# Patient Record
Sex: Female | Born: 1975 | Race: Black or African American | Hispanic: No | Marital: Married | Smoking: Never smoker
Health system: Southern US, Community
[De-identification: ages and names within clinical notes are randomized; demographics above are authoritative.]

## PROBLEM LIST (undated history)

## (undated) DIAGNOSIS — K819 Cholecystitis, unspecified: Secondary | ICD-10-CM

## (undated) DIAGNOSIS — E079 Disorder of thyroid, unspecified: Secondary | ICD-10-CM

## (undated) DIAGNOSIS — R079 Chest pain, unspecified: Secondary | ICD-10-CM

## (undated) DIAGNOSIS — K8020 Calculus of gallbladder without cholecystitis without obstruction: Secondary | ICD-10-CM

## (undated) DIAGNOSIS — S83289A Other tear of lateral meniscus, current injury, unspecified knee, initial encounter: Secondary | ICD-10-CM

## (undated) DIAGNOSIS — Z641 Problems related to multiparity: Secondary | ICD-10-CM

## (undated) DIAGNOSIS — M25561 Pain in right knee: Secondary | ICD-10-CM

## (undated) HISTORY — DX: Disorder of thyroid, unspecified: E07.9

## (undated) HISTORY — DX: Pain in right knee: M25.561

## (undated) HISTORY — DX: Calculus of gallbladder without cholecystitis without obstruction: K80.20

## (undated) HISTORY — DX: Other tear of lateral meniscus, current injury, unspecified knee, initial encounter: S83.289A

## (undated) HISTORY — DX: Chest pain, unspecified: R07.9

## (undated) HISTORY — PX: APPENDECTOMY: SHX54

## (undated) HISTORY — DX: Problems related to multiparity: Z64.1

## (undated) HISTORY — PX: OTHER SURGICAL HISTORY: SHX169

## (undated) SURGERY — REPAIR, HERNIA, VENTRAL
Anesthesia: General

---

## 2007-03-29 ENCOUNTER — Ambulatory Visit: Payer: Self-pay | Admitting: Family Medicine

## 2007-06-20 ENCOUNTER — Ambulatory Visit: Payer: Self-pay | Admitting: Family Medicine

## 2007-07-10 ENCOUNTER — Ambulatory Visit: Payer: Self-pay | Admitting: Family Medicine

## 2007-07-18 ENCOUNTER — Ambulatory Visit: Payer: Self-pay | Admitting: Family Medicine

## 2007-09-19 ENCOUNTER — Ambulatory Visit: Payer: Self-pay | Admitting: Family Medicine

## 2007-12-19 ENCOUNTER — Ambulatory Visit: Payer: Self-pay | Admitting: Internal Medicine

## 2008-02-20 ENCOUNTER — Ambulatory Visit: Payer: Self-pay | Admitting: Family Medicine

## 2008-03-12 ENCOUNTER — Emergency Department (HOSPITAL_COMMUNITY): Admission: EM | Admit: 2008-03-12 | Discharge: 2008-03-12 | Payer: Self-pay | Admitting: Emergency Medicine

## 2008-03-28 ENCOUNTER — Encounter (INDEPENDENT_AMBULATORY_CARE_PROVIDER_SITE_OTHER): Payer: Self-pay | Admitting: Family Medicine

## 2008-03-28 ENCOUNTER — Ambulatory Visit: Payer: Self-pay | Admitting: Family Medicine

## 2008-03-28 ENCOUNTER — Other Ambulatory Visit: Admission: RE | Admit: 2008-03-28 | Discharge: 2008-03-28 | Payer: Self-pay | Admitting: Family Medicine

## 2008-03-30 ENCOUNTER — Encounter (INDEPENDENT_AMBULATORY_CARE_PROVIDER_SITE_OTHER): Payer: Self-pay | Admitting: Family Medicine

## 2008-04-02 ENCOUNTER — Ambulatory Visit: Payer: Self-pay | Admitting: Internal Medicine

## 2008-05-28 ENCOUNTER — Ambulatory Visit: Payer: Self-pay | Admitting: Family Medicine

## 2008-07-19 ENCOUNTER — Encounter: Payer: Self-pay | Admitting: Obstetrics & Gynecology

## 2008-07-19 ENCOUNTER — Ambulatory Visit: Payer: Self-pay | Admitting: Obstetrics & Gynecology

## 2008-07-23 ENCOUNTER — Ambulatory Visit (HOSPITAL_COMMUNITY): Admission: RE | Admit: 2008-07-23 | Discharge: 2008-07-23 | Payer: Self-pay | Admitting: Family Medicine

## 2008-07-29 ENCOUNTER — Ambulatory Visit: Payer: Self-pay | Admitting: *Deleted

## 2008-08-20 ENCOUNTER — Ambulatory Visit: Payer: Self-pay | Admitting: Internal Medicine

## 2008-10-02 ENCOUNTER — Ambulatory Visit: Payer: Self-pay | Admitting: Obstetrics and Gynecology

## 2008-10-07 ENCOUNTER — Ambulatory Visit (HOSPITAL_COMMUNITY): Admission: RE | Admit: 2008-10-07 | Discharge: 2008-10-07 | Payer: Self-pay | Admitting: Obstetrics & Gynecology

## 2008-11-06 ENCOUNTER — Ambulatory Visit: Payer: Self-pay | Admitting: Obstetrics and Gynecology

## 2008-12-31 ENCOUNTER — Ambulatory Visit (HOSPITAL_COMMUNITY): Admission: RE | Admit: 2008-12-31 | Discharge: 2008-12-31 | Payer: Self-pay | Admitting: Family Medicine

## 2009-01-01 ENCOUNTER — Ambulatory Visit: Payer: Self-pay | Admitting: Obstetrics and Gynecology

## 2009-01-02 ENCOUNTER — Encounter: Payer: Self-pay | Admitting: Obstetrics & Gynecology

## 2009-01-02 LAB — CONVERTED CEMR LAB
Clue Cells Wet Prep HPF POC: NONE SEEN
Yeast Wet Prep HPF POC: NONE SEEN

## 2009-04-22 ENCOUNTER — Emergency Department (HOSPITAL_COMMUNITY): Admission: EM | Admit: 2009-04-22 | Discharge: 2009-04-22 | Payer: Self-pay | Admitting: Family Medicine

## 2009-09-17 ENCOUNTER — Encounter: Admission: RE | Admit: 2009-09-17 | Discharge: 2009-09-17 | Payer: Self-pay | Admitting: Infectious Diseases

## 2009-10-06 ENCOUNTER — Emergency Department (HOSPITAL_COMMUNITY): Admission: EM | Admit: 2009-10-06 | Discharge: 2009-10-06 | Payer: Self-pay | Admitting: Emergency Medicine

## 2009-11-18 ENCOUNTER — Ambulatory Visit: Payer: Self-pay | Admitting: Family Medicine

## 2010-02-03 ENCOUNTER — Ambulatory Visit: Payer: Self-pay | Admitting: Family Medicine

## 2010-02-03 LAB — CONVERTED CEMR LAB
ALT: 9 units/L (ref 0–35)
AST: 14 units/L (ref 0–37)
Albumin: 3.9 g/dL (ref 3.5–5.2)
Alkaline Phosphatase: 42 units/L (ref 39–117)
Basophils Absolute: 0 10*3/uL (ref 0.0–0.1)
Glucose, Bld: 94 mg/dL (ref 70–99)
Hemoglobin: 11.7 g/dL — ABNORMAL LOW (ref 12.0–15.0)
LDL Cholesterol: 87 mg/dL (ref 0–99)
Lymphocytes Relative: 37 % (ref 12–46)
Monocytes Absolute: 0.5 10*3/uL (ref 0.1–1.0)
Neutro Abs: 2.4 10*3/uL (ref 1.7–7.7)
Neutrophils Relative %: 51 % (ref 43–77)
Potassium: 4 meq/L (ref 3.5–5.3)
RDW: 13.3 % (ref 11.5–15.5)
Sodium: 137 meq/L (ref 135–145)
Total Bilirubin: 0.3 mg/dL (ref 0.3–1.2)
Total Protein: 6.4 g/dL (ref 6.0–8.3)
VLDL: 7 mg/dL (ref 0–40)
Vit D, 25-Hydroxy: 21 ng/mL — ABNORMAL LOW (ref 30–89)

## 2010-02-24 ENCOUNTER — Ambulatory Visit: Payer: Self-pay | Admitting: Family Medicine

## 2010-02-24 LAB — CONVERTED CEMR LAB: Chlamydia, DNA Probe: NEGATIVE

## 2010-09-01 ENCOUNTER — Emergency Department (HOSPITAL_COMMUNITY): Admission: EM | Admit: 2010-09-01 | Discharge: 2010-09-01 | Payer: Self-pay | Admitting: Emergency Medicine

## 2010-09-22 ENCOUNTER — Ambulatory Visit (HOSPITAL_COMMUNITY)
Admission: RE | Admit: 2010-09-22 | Discharge: 2010-09-22 | Payer: Self-pay | Source: Home / Self Care | Admitting: Surgery

## 2010-10-08 ENCOUNTER — Emergency Department (HOSPITAL_COMMUNITY)
Admission: EM | Admit: 2010-10-08 | Discharge: 2010-10-09 | Payer: Self-pay | Source: Home / Self Care | Admitting: Emergency Medicine

## 2010-11-01 NOTE — L&D Delivery Note (Addendum)
Delivery Note At 10:07 AM a healthy female was delivered via Vaginal, Spontaneous Delivery (Presentation: Right Occiput Anterior).  APGAR: 9, 9; weight 7 lb 12 oz (3515 g).   Placenta status: Intact, Spontaneous.  Cord: 3 vessels with the following complications: Knot, nuchal cord  Anesthesia: Epidural  Episiotomy: None Lacerations: 1st degree;Perineal. 1st degree right anterior labial  Suture Repair: 2.0 3.0 vicryl Est. Blood Loss (mL): 500  Mom to postpartum.  Baby to nursery-stable.  Delivery attended by Candelaria Celeste, DO  [place], STEPHEN 10/08/2011, 10:44 AM  I precepted Dr Durene Cal, PGY1 during this delivery.  I agree with the note as above. Candelaria Celeste JEHIEL 10/08/2011 11:03 AM

## 2010-11-22 ENCOUNTER — Encounter: Payer: Self-pay | Admitting: *Deleted

## 2010-12-24 ENCOUNTER — Inpatient Hospital Stay (INDEPENDENT_AMBULATORY_CARE_PROVIDER_SITE_OTHER)
Admission: RE | Admit: 2010-12-24 | Discharge: 2010-12-24 | Disposition: A | Discharge status: Home / Self Care | Payer: Self-pay | Source: Ambulatory Visit | Attending: Family Medicine | Admitting: Family Medicine

## 2010-12-24 DIAGNOSIS — R070 Pain in throat: Secondary | ICD-10-CM

## 2010-12-24 LAB — POCT RAPID STREP A (OFFICE): Streptococcus, Group A Screen (Direct): NEGATIVE

## 2011-01-12 LAB — TSH: TSH: 2.678 u[IU]/mL (ref 0.350–4.500)

## 2011-01-12 LAB — POCT I-STAT, CHEM 8
Calcium, Ion: 1.13 mmol/L (ref 1.12–1.32)
Glucose, Bld: 95 mg/dL (ref 70–99)
HCT: 39 % (ref 36.0–46.0)
TCO2: 26 mmol/L (ref 0–100)

## 2011-02-08 LAB — POCT URINALYSIS DIP (DEVICE)
Glucose, UA: NEGATIVE mg/dL
Nitrite: NEGATIVE
Protein, ur: 30 mg/dL — AB
Specific Gravity, Urine: 1.025 (ref 1.005–1.030)
Urobilinogen, UA: 0.2 mg/dL (ref 0.0–1.0)

## 2011-02-08 LAB — GC/CHLAMYDIA PROBE AMP, GENITAL: GC Probe Amp, Genital: NEGATIVE

## 2011-02-08 LAB — POCT PREGNANCY, URINE: Preg Test, Ur: NEGATIVE

## 2011-02-11 LAB — POCT URINALYSIS DIP (DEVICE)
Bilirubin Urine: NEGATIVE
Ketones, ur: NEGATIVE mg/dL
Protein, ur: NEGATIVE mg/dL
pH: 5.5 (ref 5.0–8.0)

## 2011-03-04 ENCOUNTER — Ambulatory Visit: Payer: Self-pay | Admitting: Obstetrics and Gynecology

## 2011-03-04 ENCOUNTER — Other Ambulatory Visit: Payer: Self-pay | Admitting: Obstetrics and Gynecology

## 2011-03-04 DIAGNOSIS — Z3201 Encounter for pregnancy test, result positive: Secondary | ICD-10-CM

## 2011-03-16 ENCOUNTER — Encounter (INDEPENDENT_AMBULATORY_CARE_PROVIDER_SITE_OTHER): Payer: Self-pay | Admitting: Surgery

## 2011-03-16 NOTE — Group Therapy Note (Signed)
NAME:  Danielle Cohen, Danielle Cohen NO.:  [id number]   MEDICAL RECORD NO.:  [id number]          PATIENT TYPE:  WOC   LOCATION:  [facility]                   FACILITY:  [facility]   PHYSICIAN:  Argentina Donovan, MD        DATE OF BIRTH:  1976/01/28   DATE OF SERVICE:  01/01/2009                                  CLINIC NOTE   The patient is a 35 year old [place] lady, gravida 2, para 2-0-0-2, who  had recurrent ovarian cysts, and she used a Mirena IUD, was placed on  oral contraceptives 6 weeks ago.  A recent ultrasound shows complete  resolution.  Today, she complains of discomfort on her legs and around  the vagina.  Urinalysis was negative.  Wet prep was taken.  On  examination, the small area of the inner thigh on both sides with small  raised bumps that appear to be from friction between her thighs.  I have  suggested that she use an aluminum-based deodorant in that area after  showering and then in between use baby powder for lubrication to see if  that would reduce it around the vagina, and I have ordered Mycolog-II  cream generic and pending the report on the wet prep.  Although, I see  no real sign of infection at all.   IMPRESSION:  Irritation in the inner thighs due to friction and  vulvitis, and also the patient is complaining of mask of pregnancy.  Though she has had for some time, with her coloration, it is barely  visible.  However, I have suggested that she seek a dermatologist as I  am a little bit of afraid the use any kind of creams on the face because  of the thin skin and the possibility of causing a reversible thinning.  I told the patient if she is not asymptomatic with the rash in her legs  within the next couple of weeks to come back.           ______________________________  Argentina Donovan, MD     PR/MEDQ  D:  01/01/2009  T:  01/02/2009  Job:  [id number]

## 2011-03-16 NOTE — Group Therapy Note (Signed)
NAME:  [name], [name] NO.:  [id number]   MEDICAL RECORD NO.:  [id number]          PATIENT TYPE:  WOC   LOCATION:  [facility]                   FACILITY:  [facility]   PHYSICIAN:  Argentina Donovan, MD        DATE OF BIRTH:  04-21-1976   DATE OF SERVICE:                                  CLINIC NOTE   HISTORY:  The patient is a 35 year old [place] female who had a  followup because of an ovarian cyst and was previously seen, and had  previous pelvic pain which has resolved.  Was found to have an increased  size of septated verses 2 adjacent simple cysts of the right ovary from  a prior study.  It has been suggested that she has a followup again in 6  weeks.  We are going to follow through with that and get an ultrasound  to make sure there is nothing going on.  This probably is just a  functional cyst.  In addition, she does have a Mirena IUD.  I am going  to place her on oral contraceptive to see if we can resolve that cyst  quicker and have her move up her ultrasound to early March.  We will  have her come back again for followup after that.           ______________________________  Argentina Donovan, MD     PR/MEDQ  D:  11/06/2008  T:  11/06/2008  Job:  [id number]

## 2011-03-16 NOTE — Group Therapy Note (Signed)
NAME:  Danielle Cohen, RISING NO.:  [id number]   MEDICAL RECORD NO.:  [id number]          PATIENT TYPE:  WOC   LOCATION:  [facility]                   FACILITY:  [facility]   PHYSICIAN:  Elsie Lincoln, MD      DATE OF BIRTH:  03-20-1976   DATE OF SERVICE:  07/19/2008                                  CLINIC NOTE   The patient is a 35 year old, G4, para 4-0-0-5 female, LMP July 04, 2008, who presents to me from [facility].  The patient has had chronic  pelvic pain at some point during the day everyday for some time now.  The patient speaks Arabic.  We do have a language difficulty even though  language line [phone] was used.  The pain does little worse with urination.  If her bladder gets full, it does cause problems.  She has pain with  intercourse, not insertional, but deeper penetration.  She has no pain  with bowel movements.  She has daily bowel movements that are soft.  Tylenol and Advil do make the pain better.  She states that she is in a  good relationship with her husband.  He does not demand sex from her and  may only have sex when she is in the mood once or twice a week.  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  The patient has had a CT scan of the abdomen in May with no  acute abdominal process and the IUD in the correct position.  She has  had a history of appendectomy in the past.  She never had transvaginal  ultrasound.   PAST MEDICAL HISTORY:  Denies all problems.   PAST SURGICAL HISTORY:  Appendectomy.   OB HISTORY:  NSVD x4 with 1 twin delivery.   FAMILY HISTORY:  No history of breast, ovarian, uterine, or colon  cancer.   SOCIAL HISTORY:  No drinking, drugs, alcohol, or caffeine.   MEDICATIONS:  Tylenol p.r.n.   ALLERGIES:  None.  No allergy to latex.   GYNECOLOGIC HISTORY:  Uses an IUD, the Mirena, for contraception.  She  has intermittent periods due to the Mirena IUD, and I explained to her  this was  normal.   Thirteen-point review of systems is negative.   PHYSICAL EXAMINATION:  VITAL SIGNS:  Temperature 97.6, pulse 90, blood  pressure 105/68, weight 190.5, height 5 feet 3 inches.  GENERAL:  Well nourished, well developed, in no apparent distress.  HEENT:  Normocephalic, atraumatic.  CHEST:  Normal movement.  ABDOMEN:  Soft, nontender.  No rebound, no guarding, no hernia.  GENITALIA:  Tanner V, shaven.  Vagina pink, normal rugae.  Cervix large,  IUD string seen.  Urethra and bladder extremely tender.  Uterus  nontender and mobile, normal size, right adnexa, mild tenderness, no  masses.  Left adnexa nontender, no masses.  EXTREMITIES:  Nontender.   ASSESSMENT/PLAN:  A 35 year old female with chronic pelvic pain.  1. Pain much worse over bladder and urethra.  We will send urinalysis,      urine culture.  2. We will get transvaginal  ultrasound to evaluate ovaries better.  3. Return to clinic in 2-3 weeks.           ______________________________  Elsie Lincoln, MD     KL/MEDQ  D:  07/19/2008  T:  07/19/2008  Job:  [id number]   cc:   Dala Dock

## 2011-03-17 ENCOUNTER — Other Ambulatory Visit: Payer: Self-pay | Admitting: Physician Assistant

## 2011-03-17 ENCOUNTER — Other Ambulatory Visit: Payer: Self-pay | Admitting: Obstetrics and Gynecology

## 2011-03-17 DIAGNOSIS — O09529 Supervision of elderly multigravida, unspecified trimester: Secondary | ICD-10-CM

## 2011-03-17 DIAGNOSIS — Z331 Pregnant state, incidental: Secondary | ICD-10-CM

## 2011-03-17 DIAGNOSIS — Z3682 Encounter for antenatal screening for nuchal translucency: Secondary | ICD-10-CM

## 2011-03-17 LAB — GC/CHLAMYDIA PROBE AMP, GENITAL
Chlamydia: NEGATIVE
Gonorrhea: NEGATIVE

## 2011-03-17 LAB — POCT URINALYSIS DIP (DEVICE)
Bilirubin Urine: NEGATIVE
Ketones, ur: NEGATIVE mg/dL
Protein, ur: NEGATIVE mg/dL
pH: 7 (ref 5.0–8.0)

## 2011-03-17 LAB — CBC
Hemoglobin: 11.4 g/dL — AB (ref 12.0–16.0)
Platelets: 392 10*3/uL (ref 150–399)

## 2011-03-17 LAB — RPR: RPR: NONREACTIVE

## 2011-03-17 LAB — HEPATITIS B SURFACE ANTIGEN: Hepatitis B Surface Ag: NEGATIVE

## 2011-03-31 ENCOUNTER — Ambulatory Visit (HOSPITAL_COMMUNITY)
Admission: RE | Admit: 2011-03-31 | Discharge: 2011-03-31 | Disposition: A | Discharge status: Home / Self Care | Payer: Self-pay | Source: Ambulatory Visit | Attending: Physician Assistant | Admitting: Physician Assistant

## 2011-03-31 ENCOUNTER — Other Ambulatory Visit: Payer: Self-pay | Admitting: Physician Assistant

## 2011-03-31 DIAGNOSIS — O351XX0 Maternal care for (suspected) chromosomal abnormality in fetus, not applicable or unspecified: Secondary | ICD-10-CM | POA: Insufficient documentation

## 2011-03-31 DIAGNOSIS — O09529 Supervision of elderly multigravida, unspecified trimester: Secondary | ICD-10-CM

## 2011-03-31 DIAGNOSIS — O3510X0 Maternal care for (suspected) chromosomal abnormality in fetus, unspecified, not applicable or unspecified: Secondary | ICD-10-CM | POA: Insufficient documentation

## 2011-03-31 DIAGNOSIS — Z3682 Encounter for antenatal screening for nuchal translucency: Secondary | ICD-10-CM

## 2011-04-14 ENCOUNTER — Other Ambulatory Visit: Payer: Self-pay | Admitting: Family

## 2011-04-14 DIAGNOSIS — O09529 Supervision of elderly multigravida, unspecified trimester: Secondary | ICD-10-CM

## 2011-04-14 DIAGNOSIS — O0940 Supervision of pregnancy with grand multiparity, unspecified trimester: Secondary | ICD-10-CM

## 2011-04-14 LAB — POCT URINALYSIS DIP (DEVICE)
Ketones, ur: NEGATIVE mg/dL
Protein, ur: NEGATIVE mg/dL
Specific Gravity, Urine: 1.02 (ref 1.005–1.030)
Urobilinogen, UA: 0.2 mg/dL (ref 0.0–1.0)
pH: 8.5 — ABNORMAL HIGH (ref 5.0–8.0)

## 2011-05-12 ENCOUNTER — Other Ambulatory Visit: Payer: Self-pay | Admitting: Obstetrics and Gynecology

## 2011-05-12 DIAGNOSIS — O09529 Supervision of elderly multigravida, unspecified trimester: Secondary | ICD-10-CM

## 2011-05-12 LAB — POCT URINALYSIS DIP (DEVICE)
Hgb urine dipstick: NEGATIVE
Nitrite: NEGATIVE
Protein, ur: NEGATIVE mg/dL
Specific Gravity, Urine: 1.025 (ref 1.005–1.030)
Urobilinogen, UA: 0.2 mg/dL (ref 0.0–1.0)
pH: 7 (ref 5.0–8.0)

## 2011-05-19 ENCOUNTER — Ambulatory Visit (HOSPITAL_COMMUNITY)
Admission: RE | Admit: 2011-05-19 | Discharge: 2011-05-19 | Disposition: A | Discharge status: Home / Self Care | Payer: Medicaid Other | Source: Ambulatory Visit | Attending: Physician Assistant | Admitting: Physician Assistant

## 2011-05-19 ENCOUNTER — Encounter (HOSPITAL_COMMUNITY): Payer: Self-pay

## 2011-05-19 DIAGNOSIS — Z3689 Encounter for other specified antenatal screening: Secondary | ICD-10-CM | POA: Insufficient documentation

## 2011-05-19 DIAGNOSIS — O09529 Supervision of elderly multigravida, unspecified trimester: Secondary | ICD-10-CM

## 2011-06-16 ENCOUNTER — Other Ambulatory Visit: Payer: Self-pay | Admitting: Family Medicine

## 2011-06-16 ENCOUNTER — Ambulatory Visit: Payer: Medicaid Other | Admitting: Family Medicine

## 2011-06-16 DIAGNOSIS — IMO0002 Reserved for concepts with insufficient information to code with codable children: Secondary | ICD-10-CM

## 2011-06-16 DIAGNOSIS — E079 Disorder of thyroid, unspecified: Secondary | ICD-10-CM | POA: Insufficient documentation

## 2011-06-16 DIAGNOSIS — Z641 Problems related to multiparity: Secondary | ICD-10-CM | POA: Insufficient documentation

## 2011-06-16 DIAGNOSIS — O2340 Unspecified infection of urinary tract in pregnancy, unspecified trimester: Secondary | ICD-10-CM | POA: Insufficient documentation

## 2011-06-16 DIAGNOSIS — O09529 Supervision of elderly multigravida, unspecified trimester: Secondary | ICD-10-CM

## 2011-06-16 DIAGNOSIS — O0940 Supervision of pregnancy with grand multiparity, unspecified trimester: Secondary | ICD-10-CM

## 2011-06-16 HISTORY — DX: Problems related to multiparity: Z64.1

## 2011-06-16 LAB — POCT URINALYSIS DIP (DEVICE)
Bilirubin Urine: NEGATIVE
Ketones, ur: NEGATIVE mg/dL
Leukocytes, UA: NEGATIVE
Specific Gravity, Urine: 1.015 (ref 1.005–1.030)
pH: >=9 (ref 5.0–8.0)

## 2011-06-28 ENCOUNTER — Encounter: Payer: Self-pay | Admitting: *Deleted

## 2011-07-05 NOTE — Progress Notes (Signed)
See ultrasound report in [facility] 

## 2011-07-07 ENCOUNTER — Encounter: Payer: Self-pay | Admitting: *Deleted

## 2011-07-14 NOTE — Progress Notes (Signed)
Pt chart still on paper, encounter opened in error.

## 2011-07-15 ENCOUNTER — Ambulatory Visit (INDEPENDENT_AMBULATORY_CARE_PROVIDER_SITE_OTHER): Payer: Medicaid Other | Admitting: Physician Assistant

## 2011-07-15 DIAGNOSIS — E079 Disorder of thyroid, unspecified: Secondary | ICD-10-CM

## 2011-07-15 DIAGNOSIS — O0990 Supervision of high risk pregnancy, unspecified, unspecified trimester: Secondary | ICD-10-CM

## 2011-07-15 LAB — CBC
Platelets: 414 10*3/uL — ABNORMAL HIGH (ref 150–400)
RBC: 3.63 MIL/uL — ABNORMAL LOW (ref 3.87–5.11)
WBC: 7 10*3/uL (ref 4.0–10.5)

## 2011-07-15 LAB — POCT URINALYSIS DIP (DEVICE)
Glucose, UA: NEGATIVE mg/dL
Leukocytes, UA: NEGATIVE
Nitrite: NEGATIVE
Urobilinogen, UA: 0.2 mg/dL (ref 0.0–1.0)

## 2011-07-15 NOTE — Progress Notes (Signed)
No complaints. + FM daily

## 2011-07-15 NOTE — Progress Notes (Signed)
No vaginal discharge. Pulse 97. Pt needs 28 week labs. 1hr gtt is due at 12:10.

## 2011-07-16 LAB — RPR

## 2011-07-16 LAB — HIV ANTIBODY (ROUTINE TESTING W REFLEX): HIV: NONREACTIVE

## 2011-07-16 LAB — TSH: TSH: 1.285 u[IU]/mL (ref 0.350–4.500)

## 2011-07-29 ENCOUNTER — Ambulatory Visit (INDEPENDENT_AMBULATORY_CARE_PROVIDER_SITE_OTHER): Payer: Medicaid Other | Admitting: Family Medicine

## 2011-07-29 VITALS — BP 102/69 | Temp 98.3°F | Wt 201.5 lb

## 2011-07-29 DIAGNOSIS — O99280 Endocrine, nutritional and metabolic diseases complicating pregnancy, unspecified trimester: Secondary | ICD-10-CM

## 2011-07-29 DIAGNOSIS — O0990 Supervision of high risk pregnancy, unspecified, unspecified trimester: Secondary | ICD-10-CM

## 2011-07-29 DIAGNOSIS — O99210 Obesity complicating pregnancy, unspecified trimester: Secondary | ICD-10-CM

## 2011-07-29 DIAGNOSIS — E039 Hypothyroidism, unspecified: Secondary | ICD-10-CM

## 2011-07-29 DIAGNOSIS — O09529 Supervision of elderly multigravida, unspecified trimester: Secondary | ICD-10-CM

## 2011-07-29 DIAGNOSIS — IMO0002 Reserved for concepts with insufficient information to code with codable children: Secondary | ICD-10-CM

## 2011-07-29 LAB — POCT URINALYSIS DIP (DEVICE)
Glucose, UA: NEGATIVE mg/dL
Nitrite: NEGATIVE
Urobilinogen, UA: 0.2 mg/dL (ref 0.0–1.0)

## 2011-07-29 NOTE — Progress Notes (Signed)
Pt would like results of lab work. No vaginal discharge. Pulse 99.

## 2011-07-29 NOTE — Progress Notes (Signed)
No concerns. TSH okay.  Will recheck in 4 weeks.  RTC 2 weeks.

## 2011-08-02 LAB — POCT URINALYSIS DIP (DEVICE)
Glucose, UA: NEGATIVE
Nitrite: NEGATIVE
Operator id: 194561
Urobilinogen, UA: 0.2

## 2011-08-09 ENCOUNTER — Encounter (HOSPITAL_COMMUNITY): Payer: Self-pay | Admitting: *Deleted

## 2011-08-09 ENCOUNTER — Inpatient Hospital Stay (HOSPITAL_COMMUNITY)
Admission: AD | Admit: 2011-08-09 | Discharge: 2011-08-09 | Disposition: A | Discharge status: Home / Self Care | Payer: Medicaid Other | Source: Ambulatory Visit | Attending: Obstetrics & Gynecology | Admitting: Obstetrics & Gynecology

## 2011-08-09 DIAGNOSIS — IMO0002 Reserved for concepts with insufficient information to code with codable children: Secondary | ICD-10-CM

## 2011-08-09 DIAGNOSIS — O09529 Supervision of elderly multigravida, unspecified trimester: Secondary | ICD-10-CM

## 2011-08-09 DIAGNOSIS — O99891 Other specified diseases and conditions complicating pregnancy: Secondary | ICD-10-CM | POA: Insufficient documentation

## 2011-08-09 DIAGNOSIS — O2340 Unspecified infection of urinary tract in pregnancy, unspecified trimester: Secondary | ICD-10-CM

## 2011-08-09 DIAGNOSIS — R109 Unspecified abdominal pain: Secondary | ICD-10-CM | POA: Insufficient documentation

## 2011-08-09 DIAGNOSIS — M6208 Separation of muscle (nontraumatic), other site: Secondary | ICD-10-CM

## 2011-08-09 DIAGNOSIS — Z641 Problems related to multiparity: Secondary | ICD-10-CM

## 2011-08-09 LAB — CBC
MCV: 93.6 fL (ref 78.0–100.0)
Platelets: 383 10*3/uL (ref 150–400)
RDW: 13.4 % (ref 11.5–15.5)
WBC: 7.7 10*3/uL (ref 4.0–10.5)

## 2011-08-09 LAB — COMPREHENSIVE METABOLIC PANEL
AST: 17 U/L (ref 0–37)
Albumin: 2.3 g/dL — ABNORMAL LOW (ref 3.5–5.2)
Calcium: 9.3 mg/dL (ref 8.4–10.5)
Chloride: 105 mEq/L (ref 96–112)
Creatinine, Ser: <0.47 mg/dL — ABNORMAL LOW (ref 0.50–1.10)
Total Protein: 6.1 g/dL (ref 6.0–8.3)

## 2011-08-09 LAB — URINALYSIS, ROUTINE W REFLEX MICROSCOPIC
Hgb urine dipstick: NEGATIVE
Protein, ur: NEGATIVE mg/dL
Urobilinogen, UA: 1 mg/dL (ref 0.0–1.0)

## 2011-08-09 MED ORDER — COMFORT FIT MATERNITY SUPP LG MISC: 1.0000 [IU] | Status: DC | PRN

## 2011-08-09 NOTE — ED Provider Notes (Signed)
History     Chief Complaint  Patient presents with  . Abdominal Pain   HPI 35 y.o. [id number] at [redacted]w[redacted]d. C/O upper abdominal pain, generalized, "feels like I'm wounded on the inside", does not hurt at rest, worse with movement, especially bending. Not associated with eating, no n/v. +fetal movement. No contractions or vaginal bleeding.   OB History    Grav Para Term Preterm Abortions TAB SAB Ect Mult Living   5 4 4  0 0 0 0 0 1 5      Past Medical History  Diagnosis Date  . Thyroid disease     told had a thyroid cyst 11/11- needs recheck in 6 months, not done    Past Surgical History  Procedure Date  . Appendectomy     No family history on file.  History  Substance Use Topics  . Smoking status: Never Smoker   . Smokeless tobacco: Never Used  . Alcohol Use: No    Allergies: No Known Allergies  Prescriptions prior to admission  Medication Sig Dispense Refill  . Prenatal Vitamins (DIS) TABS Take by mouth.          Review of Systems  Constitutional: Negative.   Respiratory: Negative.   Cardiovascular: Negative.   Gastrointestinal: Positive for abdominal pain. Negative for nausea, vomiting, diarrhea and constipation.  Genitourinary: Negative for dysuria, urgency, frequency, hematuria and flank pain.       Negative for vaginal bleeding, cramping/contractions  Musculoskeletal: Negative.   Neurological: Negative.   Psychiatric/Behavioral: Negative.    Physical Exam   Blood pressure 100/61, pulse 93, temperature 98 F (36.7 C), temperature source Oral, resp. rate 18, height 5' 3.5" (1.613 m), weight 93.157 kg (205 lb 6 oz), last menstrual period 01/04/2011.  Physical Exam  Constitutional: She is oriented to person, place, and time. She appears well-developed and well-nourished. No distress.  Cardiovascular: Normal rate.   Respiratory: Effort normal.  GI: Soft. She exhibits no distension. There is no tenderness.       Very soft, large diastasis recti    Musculoskeletal: Normal range of motion.  Neurological: She is alert and oriented to person, place, and time.  Skin: Skin is warm and dry.  Psychiatric: She has a normal mood and affect.   EFM: 150, mod variability, 10x10 accel present, 1 contraction noted MAU Course  Procedures Results for orders placed during the hospital encounter of 08/09/11 (from the past 24 hour(s))  URINALYSIS, ROUTINE W REFLEX MICROSCOPIC     Status: Abnormal   Collection Time   08/09/11  1:05 PM      Component Value Range   Color, Urine YELLOW  YELLOW    Appearance HAZY (*) CLEAR    Specific Gravity, Urine >1.030 (*) 1.005 - 1.030    pH 6.0  5.0 - 8.0    Glucose, UA NEGATIVE  NEGATIVE (mg/dL)   Hgb urine dipstick NEGATIVE  NEGATIVE    Bilirubin Urine NEGATIVE  NEGATIVE    Ketones, ur 15 (*) NEGATIVE (mg/dL)   Protein, ur NEGATIVE  NEGATIVE (mg/dL)   Urobilinogen, UA 1.0  0.0 - 1.0 (mg/dL)   Nitrite NEGATIVE  NEGATIVE    Leukocytes, UA NEGATIVE  NEGATIVE   CBC     Status: Abnormal   Collection Time   08/09/11  1:34 PM      Component Value Range   WBC 7.7  4.0 - 10.5 (K/uL)   RBC 3.29 (*) 3.87 - 5.11 (MIL/uL)   Hemoglobin 10.3 (*) 12.0 - 15.0 (  g/dL)   HCT 16.1 (*) 09.6 - 46.0 (%)   MCV 93.6  78.0 - 100.0 (fL)   MCH 31.3  26.0 - 34.0 (pg)   MCHC 33.4  30.0 - 36.0 (g/dL)   RDW 04.5  40.9 - 81.1 (%)   Platelets 383  150 - 400 (K/uL)  COMPREHENSIVE METABOLIC PANEL     Status: Abnormal   Collection Time   08/09/11  1:34 PM      Component Value Range   Sodium 134 (*) 135 - 145 (mEq/L)   Potassium 3.6  3.5 - 5.1 (mEq/L)   Chloride 105  96 - 112 (mEq/L)   CO2 22  19 - 32 (mEq/L)   Glucose, Bld 89  70 - 99 (mg/dL)   BUN 11  6 - 23 (mg/dL)   Creatinine, Ser <9.14 (*) 0.50 - 1.10 (mg/dL)   Calcium 9.3  8.4 - 78.2 (mg/dL)   Total Protein 6.1  6.0 - 8.3 (g/dL)   Albumin 2.3 (*) 3.5 - 5.2 (g/dL)   AST 17  0 - 37 (U/L)   ALT 11  0 - 35 (U/L)   Alkaline Phosphatase 89  39 - 117 (U/L)   Total Bilirubin 0.1  (*) 0.3 - 1.2 (mg/dL)   GFR calc non Af Amer NOT CALCULATED  >90 (mL/min)   GFR calc Af Amer NOT CALCULATED  >90 (mL/min)     Assessment and Plan  35 y.o. [id number] at [redacted]w[redacted]d Upper abdominal pain - likely related to normal pregnancy changes/muscular in nature Maternity support brace Follow up as scheduled  Thorsten Climer 08/09/2011, 2:29 PM

## 2011-08-09 NOTE — ED Notes (Signed)
Upper abdominal pain that comes and goes, worse with movement. Points to upper abdomen from one side to the other.

## 2011-08-09 NOTE — Progress Notes (Signed)
Pt states r sided upper abd pain began Saturday, goes across upper abdomen, denies n/v/d, +FM, no bleeding or lof. Pain worse with inspiration.

## 2011-08-12 ENCOUNTER — Ambulatory Visit (INDEPENDENT_AMBULATORY_CARE_PROVIDER_SITE_OTHER): Payer: Medicaid Other | Admitting: Obstetrics and Gynecology

## 2011-08-12 DIAGNOSIS — Z641 Problems related to multiparity: Secondary | ICD-10-CM

## 2011-08-12 DIAGNOSIS — O09529 Supervision of elderly multigravida, unspecified trimester: Secondary | ICD-10-CM

## 2011-08-12 DIAGNOSIS — Z1329 Encounter for screening for other suspected endocrine disorder: Secondary | ICD-10-CM

## 2011-08-12 DIAGNOSIS — IMO0002 Reserved for concepts with insufficient information to code with codable children: Secondary | ICD-10-CM

## 2011-08-12 LAB — POCT URINALYSIS DIP (DEVICE)
Bilirubin Urine: NEGATIVE
Ketones, ur: NEGATIVE mg/dL
Specific Gravity, Urine: 1.02 (ref 1.005–1.030)
pH: 7 (ref 5.0–8.0)

## 2011-08-12 NOTE — Progress Notes (Signed)
Pulse 111. Abdomen pain was seen in [place] on 08/09/2011. No vaginal discharge. Pt states received flu vaccine at last visit and signed consent. Consent has not been scanned and can not find documentation of flu vaccine but pt can describe nurse that gave vaccine.

## 2011-08-12 NOTE — Progress Notes (Signed)
Patient without complaints. Med hx reviewed. Patient reports h/o thyroid cyst which was aspirated and found to be benign, no further interventions were required. Patient was never started on medication nor told that medications were required. Will repeat TSH today and transfer patient to low risk clinic if normal. FM/PTL precautions reviewed

## 2011-09-02 ENCOUNTER — Ambulatory Visit (INDEPENDENT_AMBULATORY_CARE_PROVIDER_SITE_OTHER): Payer: Medicaid Other | Admitting: Obstetrics & Gynecology

## 2011-09-02 ENCOUNTER — Encounter: Payer: Self-pay | Admitting: Obstetrics & Gynecology

## 2011-09-02 VITALS — BP 97/69 | Temp 97.6°F | Wt 203.9 lb

## 2011-09-02 DIAGNOSIS — E079 Disorder of thyroid, unspecified: Secondary | ICD-10-CM

## 2011-09-02 DIAGNOSIS — O09529 Supervision of elderly multigravida, unspecified trimester: Secondary | ICD-10-CM

## 2011-09-02 DIAGNOSIS — O0990 Supervision of high risk pregnancy, unspecified, unspecified trimester: Secondary | ICD-10-CM

## 2011-09-02 DIAGNOSIS — Z23 Encounter for immunization: Secondary | ICD-10-CM

## 2011-09-02 LAB — POCT URINALYSIS DIP (DEVICE)
Ketones, ur: NEGATIVE mg/dL
pH: 6 (ref 5.0–8.0)

## 2011-09-02 MED ORDER — TETANUS-DIPHTH-ACELL PERTUSSIS 5-2.5-18.5 LF-MCG/0.5 IM SUSP
0.5000 mL | Freq: Once | INTRAMUSCULAR | Status: AC
  Administered 2011-09-02: 0.5 mL via INTRAMUSCULAR

## 2011-09-02 NOTE — Progress Notes (Signed)
F/U U/S with MFM Nov. 2, 2012 at 115pm.

## 2011-09-02 NOTE — Progress Notes (Signed)
Would like tdap today, consent signed.

## 2011-09-02 NOTE — Progress Notes (Signed)
TSH WNL last visit.  MFM recommend growth 10 weeks after last [place].  Will order today.  ?Chelasea on left eyelid.  No drainage, vision WNL.  No fever.  Will monitor and refer to opthalmology as needed.

## 2011-09-03 ENCOUNTER — Ambulatory Visit (HOSPITAL_COMMUNITY)
Admission: RE | Admit: 2011-09-03 | Discharge: 2011-09-03 | Disposition: A | Discharge status: Home / Self Care | Payer: Medicaid Other | Source: Ambulatory Visit | Attending: Obstetrics & Gynecology | Admitting: Obstetrics & Gynecology

## 2011-09-03 DIAGNOSIS — O0990 Supervision of high risk pregnancy, unspecified, unspecified trimester: Secondary | ICD-10-CM

## 2011-09-03 DIAGNOSIS — O09529 Supervision of elderly multigravida, unspecified trimester: Secondary | ICD-10-CM | POA: Insufficient documentation

## 2011-09-03 DIAGNOSIS — Z3689 Encounter for other specified antenatal screening: Secondary | ICD-10-CM | POA: Insufficient documentation

## 2011-09-05 ENCOUNTER — Emergency Department (HOSPITAL_COMMUNITY)
Admission: EM | Admit: 2011-09-05 | Discharge: 2011-09-05 | Disposition: A | Discharge status: Home / Self Care | Payer: Medicaid Other | Attending: Emergency Medicine | Admitting: Emergency Medicine

## 2011-09-05 DIAGNOSIS — E079 Disorder of thyroid, unspecified: Secondary | ICD-10-CM | POA: Insufficient documentation

## 2011-09-05 DIAGNOSIS — H00019 Hordeolum externum unspecified eye, unspecified eyelid: Secondary | ICD-10-CM | POA: Insufficient documentation

## 2011-09-05 MED ORDER — ERYTHROMYCIN 5 MG/GM OP OINT: TOPICAL_OINTMENT | OPHTHALMIC | Status: AC

## 2011-09-05 NOTE — ED Provider Notes (Signed)
History     CSN: [id number] Arrival date & time: 09/05/2011 10:54 AM   First MD Initiated Contact with Patient 09/05/11 1107      No chief complaint on file.    (Consider location/radiation/quality/duration/timing/severity/associated sxs/prior treatment) Patient is a 35 y.o. female presenting with eye problem.  Eye Problem  This is a new problem.  pt states that she developed bump on her left eyelid a couple of days ago. It is painful, not she denies the bump draining any fluid. Denies fever, difficulty moving eye, and change in vision  Pt has a bump on eyelid for two weeks. Past Medical History  Diagnosis Date  . Thyroid disease     told had a thyroid cyst 11/11- needs recheck in 6 months, not done    Past Surgical History  Procedure Date  . Appendectomy     No family history on file.  History  Substance Use Topics  . Smoking status: Never Smoker   . Smokeless tobacco: Never Used  . Alcohol Use: No    OB History    Grav Para Term Preterm Abortions TAB SAB Ect Mult Living   5 4 4  0 0 0 0 0 1 5      Review of Systems  All other systems reviewed and are negative.    Allergies  Review of patient's allergies indicates no known allergies.  Home Medications   Current Outpatient Rx  Name Route Sig Dispense Refill  . COMFORT FIT MATERNITY SUPP LG MISC Does not apply 1 Units by Does not apply route as needed. 1 each 0  . PRENATAL VITAMINS (DIS) PO TABS Oral Take by mouth.        LMP 01/04/2011  Physical Exam  Constitutional: She appears well-developed and well-nourished.  HENT:  Head: Normocephalic and atraumatic.  Eyes: Conjunctivae and EOM are normal. Pupils are equal, round, and reactive to light.    Neck: Trachea normal, normal range of motion and full passive range of motion without pain. Neck supple.  Cardiovascular: Normal rate, regular rhythm and normal pulses.   Pulmonary/Chest: Effort normal and breath sounds normal. Chest wall is not dull to  percussion. She exhibits no tenderness, no crepitus, no edema, no deformity and no retraction.  Abdominal: Soft. Normal appearance and bowel sounds are normal.  Musculoskeletal: Normal range of motion.  Lymphadenopathy:       Head (right side): No submental, no submandibular, no tonsillar, no preauricular, no posterior auricular and no occipital adenopathy present.       Head (left side): No submental, no submandibular, no tonsillar, no preauricular, no posterior auricular and no occipital adenopathy present.    She has no cervical adenopathy.    She has no axillary adenopathy.  Neurological: She is alert. She has normal strength.  Skin: Skin is warm, dry and intact.  Psychiatric: She has a normal mood and affect. Her speech is normal and behavior is normal. Judgment and thought content normal. Cognition and memory are normal.    ED Course  Procedures (including critical care time)  Labs Reviewed - No data to display US Ob Follow Up  09/03/2011  OBSTETRICAL ULTRASOUND: This exam was performed within a [place] [facility]. The OB US report was generated in the [facility] system, and faxed to the ordering physician.   This report is also available in [employer] and in the [employer]. See [facility] Obstetric US report.     No diagnosis found.    MDM  Discussed care plan with patient and she voices her understanding.        Dorthula Matas, PA 09/11/11 0730

## 2011-09-11 NOTE — ED Provider Notes (Signed)
Medical screening examination/treatment/procedure(s) were performed by non-physician practitioner and as supervising physician I was immediately available for consultation/collaboration.  Yasaman Kolek, MD 09/11/11 0748 

## 2011-09-16 ENCOUNTER — Ambulatory Visit (INDEPENDENT_AMBULATORY_CARE_PROVIDER_SITE_OTHER): Payer: Medicaid Other | Admitting: Obstetrics & Gynecology

## 2011-09-16 ENCOUNTER — Encounter: Payer: Self-pay | Admitting: *Deleted

## 2011-09-16 DIAGNOSIS — O09529 Supervision of elderly multigravida, unspecified trimester: Secondary | ICD-10-CM

## 2011-09-16 DIAGNOSIS — E079 Disorder of thyroid, unspecified: Secondary | ICD-10-CM

## 2011-09-16 DIAGNOSIS — O2390 Unspecified genitourinary tract infection in pregnancy, unspecified trimester: Secondary | ICD-10-CM

## 2011-09-16 LAB — POCT URINALYSIS DIP (DEVICE)
Bilirubin Urine: NEGATIVE
Ketones, ur: NEGATIVE mg/dL
Protein, ur: NEGATIVE mg/dL
Specific Gravity, Urine: 1.025 (ref 1.005–1.030)
pH: 6.5 (ref 5.0–8.0)

## 2011-09-16 LAB — STREP B DNA PROBE: GBS: NEGATIVE

## 2011-09-16 MED ORDER — PRENATAL VITAMINS (DIS) PO TABS: 1.0000 | ORAL_TABLET | ORAL | Status: DC

## 2011-09-16 NOTE — Progress Notes (Signed)
STD probe and GBS done  Cervix posterior, high

## 2011-09-16 NOTE — Progress Notes (Signed)
Pulse 110. No vaginal discharge. Pt states needs refill on prenatal vitamin.

## 2011-09-17 LAB — GC/CHLAMYDIA PROBE AMP, GENITAL: Chlamydia, DNA Probe: NEGATIVE

## 2011-09-19 LAB — CULTURE, BETA STREP (GROUP B ONLY)

## 2011-10-04 ENCOUNTER — Ambulatory Visit (INDEPENDENT_AMBULATORY_CARE_PROVIDER_SITE_OTHER): Payer: Medicaid Other | Admitting: Obstetrics & Gynecology

## 2011-10-04 DIAGNOSIS — E079 Disorder of thyroid, unspecified: Secondary | ICD-10-CM

## 2011-10-04 DIAGNOSIS — O0990 Supervision of high risk pregnancy, unspecified, unspecified trimester: Secondary | ICD-10-CM | POA: Insufficient documentation

## 2011-10-04 DIAGNOSIS — IMO0002 Reserved for concepts with insufficient information to code with codable children: Secondary | ICD-10-CM

## 2011-10-04 DIAGNOSIS — O09529 Supervision of elderly multigravida, unspecified trimester: Secondary | ICD-10-CM

## 2011-10-04 LAB — POCT URINALYSIS DIP (DEVICE)
Glucose, UA: NEGATIVE mg/dL
Ketones, ur: NEGATIVE mg/dL
Specific Gravity, Urine: 1.025 (ref 1.005–1.030)
Urobilinogen, UA: 0.2 mg/dL (ref 0.0–1.0)

## 2011-10-04 NOTE — Assessment & Plan Note (Signed)
Patient left without getting TSH drawn 10/04/11

## 2011-10-04 NOTE — Patient Instructions (Signed)
Levonorgestrel intrauterine device (IUD) What is this medicine? LEVONORGESTREL IUD (LEE voe nor jes trel) is a contraceptive (birth control) device. It is used to prevent pregnancy and to treat heavy bleeding that occurs during your period. It can be used for up to 5 years. This medicine may be used for other purposes; ask your health care provider or pharmacist if you have questions. What should I tell my health care provider before I take this medicine? They need to know if you have any of these conditions: -abnormal Pap smear -cancer of the breast, uterus, or cervix -diabetes -endometritis -genital or pelvic infection now or in the past -have more than one sexual partner or your partner has more than one partner -heart disease -history of an ectopic or tubal pregnancy -immune system problems -IUD in place -liver disease or tumor -problems with blood clots or take blood-thinners -use intravenous drugs -uterus of unusual shape -vaginal bleeding that has not been explained -an unusual or allergic reaction to levonorgestrel, other hormones, silicone, or polyethylene, medicines, foods, dyes, or preservatives -pregnant or trying to get pregnant -breast-feeding How should I use this medicine? This device is placed inside the uterus by a health care professional. Talk to your pediatrician regarding the use of this medicine in children. Special care may be needed. Overdosage: If you think you have taken too much of this medicine contact a poison control center or emergency room at once. NOTE: This medicine is only for you. Do not share this medicine with others. What if I miss a dose? This does not apply. What may interact with this medicine? Do not take this medicine with any of the following medications: -amprenavir -bosentan -fosamprenavir This medicine may also interact with the following medications: -aprepitant -barbiturate medicines for inducing sleep or treating  seizures -bexarotene -griseofulvin -medicines to treat seizures like carbamazepine, ethotoin, felbamate, oxcarbazepine, phenytoin, topiramate -modafinil -pioglitazone -rifabutin -rifampin -rifapentine -some medicines to treat HIV infection like atazanavir, indinavir, lopinavir, nelfinavir, tipranavir, ritonavir -St. John's wort -warfarin This list may not describe all possible interactions. Give your health care provider a list of all the medicines, herbs, non-prescription drugs, or dietary supplements you use. Also tell them if you smoke, drink alcohol, or use illegal drugs. Some items may interact with your medicine. What should I watch for while using this medicine? Visit your doctor or health care professional for regular check ups. See your doctor if you or your partner has sexual contact with others, becomes HIV positive, or gets a sexual transmitted disease. This product does not protect you against HIV infection (AIDS) or other sexually transmitted diseases. You can check the placement of the IUD yourself by reaching up to the top of your vagina with clean fingers to feel the threads. Do not pull on the threads. It is a good habit to check placement after each menstrual period. Call your doctor right away if you feel more of the IUD than just the threads or if you cannot feel the threads at all. The IUD may come out by itself. You may become pregnant if the device comes out. If you notice that the IUD has come out use a backup birth control method like condoms and call your health care provider. Using tampons will not change the position of the IUD and are okay to use during your period. What side effects may I notice from receiving this medicine? Side effects that you should report to your doctor or health care professional as soon as possible: -allergic reactions   like skin rash, itching or hives, swelling of the face, lips, or tongue -fever, flu-like symptoms -genital sores -high  blood pressure -no menstrual period for 6 weeks during use -pain, swelling, warmth in the leg -pelvic pain or tenderness -severe or sudden headache -signs of pregnancy -stomach cramping -sudden shortness of breath -trouble with balance, talking, or walking -unusual vaginal bleeding, discharge -yellowing of the eyes or skin Side effects that usually do not require medical attention (report to your doctor or health care professional if they continue or are bothersome): -acne -breast pain -change in sex drive or performance -changes in weight -cramping, dizziness, or faintness while the device is being inserted -headache -irregular menstrual bleeding within first 3 to 6 months of use -nausea This list may not describe all possible side effects. Call your doctor for medical advice about side effects. You may report side effects to FDA at 1-800-FDA-1088. Where should I keep my medicine? This does not apply. NOTE: This sheet is a summary. It may not cover all possible information. If you have questions about this medicine, talk to your doctor, pharmacist, or health care provider.  2012, [employer] Standard. (11/08/2008 6:39:08 PM)Breastfeeding BENEFITS OF BREASTFEEDING For the baby  The first milk (colostrum) helps the baby's digestive system function better.   There are antibodies from the mother in the milk that help the baby fight off infections.   The baby has a lower incidence of asthma, allergies, and SIDS (sudden infant death syndrome).   The nutrients in breast milk are better than formulas for the baby and helps the baby's brain grow better.   Babies who breastfeed have less gas, colic, and constipation.  For the mother  Breastfeeding helps develop a very special bond between mother and baby.   It is more convenient, always available at the correct temperature and cheaper than formula feeding.   It burns calories in the mother and helps with losing weight that was  gained during pregnancy.   It makes the uterus contract back down to normal size faster and slows bleeding following delivery.   Breastfeeding mothers have a lower risk of developing breast cancer.  NURSE FREQUENTLY  A healthy, full-term baby may breastfeed as often as every hour or space his or her feedings to every 3 hours.   How often to nurse will vary from baby to baby. Watch your baby for signs of hunger, not the clock.   Nurse as often as the baby requests, or when you feel the need to reduce the fullness of your breasts.   Awaken the baby if it has been 3 to 4 hours since the last feeding.   Frequent feeding will help the mother make more milk and will prevent problems like sore nipples and engorgement of the breasts.  BABY'S POSITION AT THE BREAST  Whether lying down or sitting, be sure that the baby's tummy is facing your tummy.   Support the breast with 4 fingers underneath the breast and the thumb above. Make sure your fingers are well away from the nipple and baby's mouth.   Stroke the baby's lips and cheek closest to the breast gently with your finger or nipple.   When the baby's mouth is open wide enough, place all of your nipple and as much of the dark area around the nipple as possible into your baby's mouth.   Pull the baby in close so the tip of the nose and the baby's cheeks touch the breast during the feeding.  FEEDINGS    The length of each feeding varies from baby to baby and from feeding to feeding.   The baby must suck about 2 to 3 minutes for your milk to get to him or her. This is called a "let down." For this reason, allow the baby to feed on each breast as long as he or she wants. Your baby will end the feeding when he or she has received the right balance of nutrients.   To break the suction, put your finger into the corner of the baby's mouth and slide it between his or her gums before removing your breast from his or her mouth. This will help prevent  sore nipples.  REDUCING BREAST ENGORGEMENT  In the first week after your baby is born, you may experience signs of breast engorgement. When breasts are engorged, they feel heavy, warm, full, and may be tender to the touch. You can reduce engorgement if you:   Nurse frequently, every 2 to 3 hours. Mothers who breastfeed early and often have fewer problems with engorgement.   Place light ice packs on your breasts between feedings. This reduces swelling. Wrap the ice packs in a lightweight towel to protect your skin.   Apply moist hot packs to your breast for 5 to 10 minutes before each feeding. This increases circulation and helps the milk flow.   Gently massage your breast before and during the feeding.   Make sure that the baby empties at least one breast at every feeding before switching sides.   Use a breast pump to empty the breasts if your baby is sleepy or not nursing well. You may also want to pump if you are returning to work or or you feel you are getting engorged.   Avoid bottle feeds, pacifiers or supplemental feedings of water or juice in place of breastfeeding.   Be sure the baby is latched on and positioned properly while breastfeeding.   Prevent fatigue, stress, and anemia.   Wear a supportive bra, avoiding underwire styles.   Eat a balanced diet with enough fluids.  If you follow these suggestions, your engorgement should improve in 24 to 48 hours. If you are still experiencing difficulty, call your lactation consultant or caregiver. IS MY BABY GETTING ENOUGH MILK? Sometimes, mothers worry about whether their babies are getting enough milk. You can be assured that your baby is getting enough milk if:  The baby is actively sucking and you hear swallowing.   The baby nurses at least 8 to 12 times in a 24 hour time period. Nurse your baby until he or she unlatches or falls asleep at the first breast (at least 10 to 20 minutes), then offer the second side.   The baby is  wetting 5 to 6 disposable diapers (6 to 8 cloth diapers) in a 24 hour period by 5 to 6 days of age.   The baby is having at least 2 to 3 stools every 24 hours for the first few months. Breast milk is all the food your baby needs. It is not necessary for your baby to have water or formula. In fact, to help your breasts make more milk, it is best not to give your baby supplemental feedings during the early weeks.   The stool should be soft and yellow.   The baby should gain 4 to 7 ounces per week after he is 4 days old.  TAKE CARE OF YOURSELF Take care of your breasts by:  Bathing or showering daily.     Avoiding the use of soaps on your nipples.   Start feedings on your left breast at one feeding and on your right breast at the next feeding.   You will notice an increase in your milk supply 2 to 5 days after delivery. You may feel some discomfort from engorgement, which makes your breasts very firm and often tender. Engorgement "peaks" out within 24 to 48 hours. In the meantime, apply warm moist towels to your breasts for 5 to 10 minutes before feeding. Gentle massage and expression of some milk before feeding will soften your breasts, making it easier for your baby to latch on. Wear a well fitting nursing bra and air dry your nipples for 10 to 15 minutes after each feeding.   Only use cotton bra pads.   Only use pure lanolin on your nipples after nursing. You do not need to wash it off before nursing.  Take care of yourself by:   Eating well-balanced meals and nutritious snacks.   Drinking milk, fruit juice, and water to satisfy your thirst (about 8 glasses a day).   Getting plenty of rest.   Increasing calcium in your diet (1200 mg a day).   Avoiding foods that you notice affect the baby in a bad way.  SEEK MEDICAL CARE IF:   You have any questions or difficulty with breastfeeding.   You need help.   You have a hard, red, sore area on your breast, accompanied by a fever of 100.5  F (38.1 C) or more.   Your baby is too sleepy to eat well or is having trouble sleeping.   Your baby is wetting less than 6 diapers per day, by 5 days of age.   Your baby's skin or white part of his or her eyes is more yellow than it was in the hospital.   You feel depressed.  Document Released: 10/18/2005 Document Revised: 06/30/2011 Document Reviewed: 06/02/2009 [employer] Patient Information 2012 [employer]. 

## 2011-10-04 NOTE — Progress Notes (Signed)
TSH today.  No problems.  Breast feeding and IUD planned.  Labor signs reviewed.

## 2011-10-04 NOTE — Assessment & Plan Note (Signed)
First screen negative 03/31/11 will scan report - kpr

## 2011-10-08 ENCOUNTER — Inpatient Hospital Stay (HOSPITAL_COMMUNITY): Payer: Medicaid Other | Admitting: Anesthesiology

## 2011-10-08 ENCOUNTER — Encounter (HOSPITAL_COMMUNITY): Payer: Self-pay | Admitting: Anesthesiology

## 2011-10-08 ENCOUNTER — Inpatient Hospital Stay (HOSPITAL_COMMUNITY)
Admission: AD | Admit: 2011-10-08 | Discharge: 2011-10-10 | DRG: 775 | Disposition: A | Discharge status: Home / Self Care | Payer: Medicaid Other | Source: Ambulatory Visit | Attending: Family Medicine | Admitting: Family Medicine

## 2011-10-08 ENCOUNTER — Encounter (HOSPITAL_COMMUNITY): Payer: Self-pay | Admitting: *Deleted

## 2011-10-08 DIAGNOSIS — O09529 Supervision of elderly multigravida, unspecified trimester: Secondary | ICD-10-CM | POA: Diagnosis present

## 2011-10-08 DIAGNOSIS — O2340 Unspecified infection of urinary tract in pregnancy, unspecified trimester: Secondary | ICD-10-CM

## 2011-10-08 DIAGNOSIS — IMO0002 Reserved for concepts with insufficient information to code with codable children: Secondary | ICD-10-CM

## 2011-10-08 DIAGNOSIS — Z641 Problems related to multiparity: Secondary | ICD-10-CM

## 2011-10-08 LAB — CBC
HCT: 34.4 % — ABNORMAL LOW (ref 36.0–46.0)
MCHC: 34 g/dL (ref 30.0–36.0)
MCV: 93.2 fL (ref 78.0–100.0)
Platelets: 371 10*3/uL (ref 150–400)
RDW: 14.1 % (ref 11.5–15.5)

## 2011-10-08 MED ORDER — EPHEDRINE 5 MG/ML INJ
INTRAVENOUS | Status: AC
  Administered 2011-10-08: 10 mg
  Filled 2011-10-08: qty 4

## 2011-10-08 MED ORDER — ONDANSETRON HCL 4 MG PO TABS: 4.0000 mg | ORAL_TABLET | ORAL | Status: DC | PRN

## 2011-10-08 MED ORDER — DIPHENHYDRAMINE HCL 25 MG PO CAPS: 25.0000 mg | ORAL_CAPSULE | Freq: Four times a day (QID) | ORAL | Status: DC | PRN

## 2011-10-08 MED ORDER — TETANUS-DIPHTH-ACELL PERTUSSIS 5-2.5-18.5 LF-MCG/0.5 IM SUSP: 0.5000 mL | Freq: Once | INTRAMUSCULAR | Status: DC

## 2011-10-08 MED ORDER — LACTATED RINGERS IV SOLN
INTRAVENOUS | Status: DC
  Administered 2011-10-08 (×4): via INTRAVENOUS

## 2011-10-08 MED ORDER — FENTANYL 2.5 MCG/ML BUPIVACAINE 1/10 % EPIDURAL INFUSION (WH - ANES)
INTRAMUSCULAR | Status: AC
  Administered 2011-10-08: 14 mL/h via EPIDURAL
  Filled 2011-10-08: qty 60

## 2011-10-08 MED ORDER — WITCH HAZEL-GLYCERIN EX PADS: 1.0000 "application " | MEDICATED_PAD | CUTANEOUS | Status: DC | PRN

## 2011-10-08 MED ORDER — SIMETHICONE 80 MG PO CHEW: 80.0000 mg | CHEWABLE_TABLET | ORAL | Status: DC | PRN

## 2011-10-08 MED ORDER — ONDANSETRON HCL 4 MG/2ML IJ SOLN: 4.0000 mg | INTRAMUSCULAR | Status: DC | PRN

## 2011-10-08 MED ORDER — ZOLPIDEM TARTRATE 5 MG PO TABS: 5.0000 mg | ORAL_TABLET | Freq: Every evening | ORAL | Status: DC | PRN

## 2011-10-08 MED ORDER — TERBUTALINE SULFATE 1 MG/ML IJ SOLN: 0.2500 mg | Freq: Once | INTRAMUSCULAR | Status: DC | PRN

## 2011-10-08 MED ORDER — LACTATED RINGERS IV SOLN
500.0000 mL | INTRAVENOUS | Status: DC | PRN
Start: 2011-10-08 — End: 2011-10-08
  Administered 2011-10-08: 500 mL via INTRAVENOUS

## 2011-10-08 MED ORDER — CITRIC ACID-SODIUM CITRATE 334-500 MG/5ML PO SOLN: 30.0000 mL | ORAL | Status: DC | PRN

## 2011-10-08 MED ORDER — IBUPROFEN 600 MG PO TABS
600.0000 mg | ORAL_TABLET | Freq: Four times a day (QID) | ORAL | Status: DC
  Administered 2011-10-08 – 2011-10-10 (×9): 600 mg via ORAL
  Filled 2011-10-08 (×9): qty 1

## 2011-10-08 MED ORDER — FLEET ENEMA 7-19 GM/118ML RE ENEM: 1.0000 | ENEMA | RECTAL | Status: DC | PRN

## 2011-10-08 MED ORDER — OXYCODONE-ACETAMINOPHEN 5-325 MG PO TABS
1.0000 | ORAL_TABLET | ORAL | Status: DC | PRN
  Administered 2011-10-09: 1 via ORAL
  Filled 2011-10-08: qty 1

## 2011-10-08 MED ORDER — OXYTOCIN 20 UNITS IN LACTATED RINGERS INFUSION - SIMPLE
125.0000 mL/h | Freq: Once | INTRAVENOUS | Status: AC
  Administered 2011-10-08: 500 mL/h via INTRAVENOUS

## 2011-10-08 MED ORDER — ONDANSETRON HCL 4 MG/2ML IJ SOLN: 4.0000 mg | Freq: Four times a day (QID) | INTRAMUSCULAR | Status: DC | PRN

## 2011-10-08 MED ORDER — OXYTOCIN 20 UNITS IN LACTATED RINGERS INFUSION - SIMPLE: 1.0000 m[IU]/min | INTRAVENOUS | Status: DC

## 2011-10-08 MED ORDER — IBUPROFEN 600 MG PO TABS: 600.0000 mg | ORAL_TABLET | Freq: Four times a day (QID) | ORAL | Status: DC | PRN

## 2011-10-08 MED ORDER — PRENATAL PLUS 27-1 MG PO TABS
1.0000 | ORAL_TABLET | Freq: Every day | ORAL | Status: DC
  Administered 2011-10-09 – 2011-10-10 (×2): 1 via ORAL
  Filled 2011-10-08 (×2): qty 1

## 2011-10-08 MED ORDER — OXYCODONE-ACETAMINOPHEN 5-325 MG PO TABS: 2.0000 | ORAL_TABLET | ORAL | Status: DC | PRN

## 2011-10-08 MED ORDER — OXYTOCIN BOLUS FROM INFUSION
500.0000 mL | Freq: Once | INTRAVENOUS | Status: DC
  Filled 2011-10-08: qty 500
  Filled 2011-10-08: qty 1000

## 2011-10-08 MED ORDER — ACETAMINOPHEN 325 MG PO TABS: 650.0000 mg | ORAL_TABLET | ORAL | Status: DC | PRN

## 2011-10-08 MED ORDER — DIBUCAINE 1 % RE OINT: 1.0000 "application " | TOPICAL_OINTMENT | RECTAL | Status: DC | PRN

## 2011-10-08 MED ORDER — BENZOCAINE-MENTHOL 20-0.5 % EX AERO: 1.0000 "application " | INHALATION_SPRAY | CUTANEOUS | Status: DC | PRN

## 2011-10-08 MED ORDER — LIDOCAINE HCL (PF) 1 % IJ SOLN
30.0000 mL | INTRAMUSCULAR | Status: DC | PRN
  Filled 2011-10-08: qty 30

## 2011-10-08 MED ORDER — SENNOSIDES-DOCUSATE SODIUM 8.6-50 MG PO TABS
2.0000 | ORAL_TABLET | Freq: Every day | ORAL | Status: DC
  Administered 2011-10-08 – 2011-10-10 (×2): 2 via ORAL

## 2011-10-08 MED ORDER — LIDOCAINE HCL 1.5 % IJ SOLN
INTRAMUSCULAR | Status: DC | PRN
  Administered 2011-10-08: 5 mL via INTRADERMAL
  Administered 2011-10-08: 2 mL via INTRADERMAL
  Administered 2011-10-08: 5 mL via INTRADERMAL

## 2011-10-08 MED ORDER — PHENYLEPHRINE 40 MCG/ML (10ML) SYRINGE FOR IV PUSH (FOR BLOOD PRESSURE SUPPORT)
PREFILLED_SYRINGE | INTRAVENOUS | Status: AC
  Administered 2011-10-08: 200 ug
  Filled 2011-10-08: qty 5

## 2011-10-08 MED ORDER — LANOLIN HYDROUS EX OINT: TOPICAL_OINTMENT | CUTANEOUS | Status: DC | PRN

## 2011-10-08 NOTE — Progress Notes (Signed)
UR Chart review completed.  

## 2011-10-08 NOTE — Progress Notes (Signed)
Pt reports uc's q 7 min

## 2011-10-08 NOTE — Anesthesia Preprocedure Evaluation (Signed)
Anesthesia Evaluation  Patient identified by MRN, date of birth, ID band Patient awake    Reviewed: Allergy & Precautions, H&P , NPO status , Patient's Chart, lab work & pertinent test results, reviewed documented beta blocker date and time   History of Anesthesia Complications Negative for: history of anesthetic complications  Airway Mallampati: I TM Distance: >3 FB Neck ROM: full    Dental  (+) Teeth Intact   Pulmonary neg pulmonary ROS,  clear to auscultation        Cardiovascular neg cardio ROS regular Normal    Neuro/Psych Negative Neurological ROS  Negative Psych ROS   GI/Hepatic negative GI ROS, Neg liver ROS,   Endo/Other  Morbid obesity  Renal/GU negative Renal ROS  Genitourinary negative   Musculoskeletal   Abdominal   Peds  Hematology negative hematology ROS (+)   Anesthesia Other Findings   Reproductive/Obstetrics (+) Pregnancy                           Anesthesia Physical Anesthesia Plan  ASA: II  Anesthesia Plan: Epidural   Post-op Pain Management:    Induction:   Airway Management Planned:   Additional Equipment:   Intra-op Plan:   Post-operative Plan:   Informed Consent: I have reviewed the patients History and Physical, chart, labs and discussed the procedure including the risks, benefits and alternatives for the proposed anesthesia with the patient or authorized representative who has indicated his/her understanding and acceptance.     Plan Discussed with:   Anesthesia Plan Comments:         Anesthesia Quick Evaluation

## 2011-10-08 NOTE — H&P (Signed)
Raneen E Selmon is a 35 y.o. female, G5P4004, GBS neg, presenting for active labor. Maternal Medical History:  Reason for admission: Reason for admission: contractions.  Reason for Admission:   nauseaContractions: Onset was 3-5 hours ago.   Frequency: regular.   Duration is approximately 5 minutes.   Perceived severity is moderate.    Fetal activity: Perceived fetal activity is normal.   Last perceived fetal movement was within the past hour.    Prenatal complications: No bleeding, HIV, hypertension, pre-eclampsia or preterm labor.   Prenatal Complications - Diabetes: none.    OB History    Grav Para Term Preterm Abortions TAB SAB Ect Mult Living   5 4 4  0 0 0 0 0 1 5     Past Medical History  Diagnosis Date  . Thyroid disease     told had a thyroid cyst 11/11- needs recheck in 6 months, not done   Past Surgical History  Procedure Date  . Appendectomy    Family History: family history is not on file. Social History:  reports that she has never smoked. She has never used smokeless tobacco. She reports that she does not drink alcohol or use illicit drugs.  Review of Systems  Constitutional: Negative for fever.  HENT: Negative for congestion.   Eyes: Negative for blurred vision.  Respiratory: Negative for cough.   Cardiovascular: Negative for chest pain.  Gastrointestinal: Negative for nausea and vomiting.  Skin: Negative for rash.  Neurological: Negative for headaches.    Dilation: 6 Effacement (%): 90 Station: 0 Exam by:: O'grady Blood pressure 85/50, pulse 99, height 5\' 3"  (1.6 m), weight 93.441 kg (206 lb), last menstrual period 01/04/2011. Maternal Exam:  Uterine Assessment: Contraction strength is moderate.  Contraction frequency is regular.   Abdomen: Patient reports no abdominal tenderness. Fetal presentation: vertex  Introitus: Normal vulva. Normal vagina.  Ferning test: not done.  Nitrazine test: not done. Amniotic fluid character:  clear.     Fetal Exam Fetal Monitor Review: Baseline rate: 140.  Variability: moderate (6-25 bpm).   Pattern: accelerations present.    Fetal State Assessment: Category I - tracings are normal.     Physical Exam  Constitutional: She is oriented to person, place, and time. She appears well-developed and well-nourished.  HENT:  Head: Normocephalic and atraumatic.  Eyes: EOM are normal.  Neck: Normal range of motion. Neck supple.  Cardiovascular: Normal rate and regular rhythm.   Respiratory: Effort normal and breath sounds normal.  GI:       Soft, gravid, nontender  Neurological: She is alert and oriented to person, place, and time. She has normal reflexes.  Skin: She is not diaphoretic.    Prenatal labs: ABO, Rh: O/Positive/-- (05/16 0000) Antibody: Negative (05/16 0000) Rubella: Immune (05/16 0000) RPR: NON REAC (09/13 1214)  HBsAg: Negative (05/16 0000)  HIV: NON REACTIVE (09/13 1214)  GBS: Negative (11/15 0000)   Assessment/Plan: 35yo [id number] with healthy pregnancy who presents with ctxs x3 hrs.   -pregnancy complicated by UTI, no hx of postpartum hemorrhage -AROM now -declines epidural -plans to breast feed and desires Mirena IUD -expectant managment  Maren Reamer, Azzan Butler 10/08/2011, 4:39 AM

## 2011-10-08 NOTE — Anesthesia Procedure Notes (Signed)
Epidural Patient location during procedure: OB Start time: 10/08/2011 7:22 AM Reason for block: procedure for pain  Staffing Performed by: anesthesiologist   Preanesthetic Checklist Completed: patient identified, site marked, surgical consent, pre-op evaluation, timeout performed, IV checked, risks and benefits discussed and monitors and equipment checked  Epidural Patient position: sitting Prep: site prepped and draped and DuraPrep Patient monitoring: continuous pulse ox and blood pressure Approach: midline Injection technique: LOR air  Needle:  Needle type: Tuohy  Needle gauge: 17 G Needle length: 9 cm Catheter type: closed end flexible Catheter size: 19 Gauge Test dose: negative  Assessment Events: blood not aspirated, injection not painful, no injection resistance, negative IV test and no paresthesia  Additional Notes Discussed risk of headache, infection, bleeding, nerve injury and failed or incomplete block.  Patient voices understanding and wishes to proceed.

## 2011-10-09 LAB — CBC
HCT: 28 % — ABNORMAL LOW (ref 36.0–46.0)
Hemoglobin: 9.5 g/dL — ABNORMAL LOW (ref 12.0–15.0)
MCHC: 33.9 g/dL (ref 30.0–36.0)

## 2011-10-09 NOTE — Progress Notes (Signed)
Post Partum Day 1 Subjective: no complaints, up ad lib, voiding and tolerating PO.  Pain well controlled.  Lochia normal.  Breastfeeding going well.  Objective: Blood pressure 83/54, pulse 79, temperature 98 F (36.7 C), temperature source Oral, resp. rate 18, height 5\' 3"  (1.6 m), weight 93.441 kg (206 lb), last menstrual period 01/04/2011, SpO2 100.00%, unknown if currently breastfeeding.  Physical Exam:  General: alert, cooperative and no distress Lochia: appropriate Uterine Fundus: firm DVT Evaluation: No evidence of DVT seen on physical exam. No cords or calf tenderness.   Basename 10/09/11 0526 10/08/11 0440  HGB 9.5* 11.7*  HCT 28.0* 34.4*    Assessment/Plan: Plan for discharge tomorrow, Breastfeeding and Lactation consult.  Mirena for contraception.   LOS: 1 day   Danielle Cohen JEHIEL 10/09/2011, 6:42 AM

## 2011-10-09 NOTE — Anesthesia Postprocedure Evaluation (Signed)
  Anesthesia Post-op Note  Patient: Danielle Cohen  Procedure(s) Performed: * No procedures listed *  Patient Location: Mother/Baby  Anesthesia Type: Epidural  Level of Consciousness: awake, alert  and oriented  Airway and Oxygen Therapy: Patient Spontanous Breathing  Post-op Pain: mild  Post-op Assessment: Patient's Cardiovascular Status Stable, Respiratory Function Stable, Patent Airway, No signs of Nausea or vomiting and Pain level controlled  Post-op Vital Signs: stable  Complications: No apparent anesthesia complications

## 2011-10-10 NOTE — Progress Notes (Signed)
Post Partum Day 2 Subjective: no complaints, up ad lib, voiding, tolerating PO and + flatus  Objective: Blood pressure 91/58, pulse 78, temperature 97.8 F (36.6 C), temperature source Oral, resp. rate 18, height 5\' 3"  (1.6 m), weight 93.441 kg (206 lb), last menstrual period 01/04/2011, SpO2 100.00%, unknown if currently breastfeeding.  Physical Exam:  General: alert, cooperative and no distress Lochia: appropriate Uterine Fundus: firm DVT Evaluation: No cords or calf tenderness. No significant calf/ankle edema.   Basename 10/09/11 0526 10/08/11 0440  HGB 9.5* 11.7*  HCT 28.0* 34.4*    Assessment/Plan: Discharge home, Breastfeeding and Contraception IUD   LOS: 2 days   O'Grady, Keilah Lemire 10/10/2011, 7:42 AM

## 2011-10-10 NOTE — Discharge Summary (Addendum)
Obstetric Discharge Summary Reason for Admission: onset of labor Prenatal Procedures: none Intrapartum Procedures: spontaneous vaginal delivery Postpartum Procedures: none Complications-Operative and Postpartum: first degree perineal laceration Hemoglobin  Date Value Range Status  10/09/2011 9.5* 12.0-15.0 (g/dL) Final     REPEATED TO VERIFY     DELTA CHECK NOTED     HCT  Date Value Range Status  10/09/2011 28.0* 36.0-46.0 (%) Final    Discharge Diagnoses: Term Pregnancy-delivered  Discharge Information: Date: 10/10/2011 Activity: pelvic rest Diet: routine Medications: None Condition: stable Instructions: refer to practice specific booklet Discharge to: home Follow-up Information    Follow up with [facility]. Make an appointment in 6 weeks. (Please call or return to care if you have questions at anytime.  Otherwise, call for 6 week follow up aptpointment. )    Contact information:   [address] [place] [address]          Newborn Data: Live born female  Birth Weight: 7 lb 12 oz (3515 g) APGAR: 9, 9  Home with mother.  Maren Reamer, Cristin 10/10/2011, 7:46 AM  Agree with above. Malyna Budney S 10/13/2011 1:33 PM

## 2011-11-15 ENCOUNTER — Ambulatory Visit: Payer: Medicaid Other | Admitting: Advanced Practice Midwife

## 2011-12-02 ENCOUNTER — Emergency Department (HOSPITAL_COMMUNITY)
Admission: EM | Admit: 2011-12-02 | Discharge: 2011-12-02 | Disposition: A | Discharge status: Home / Self Care | Payer: Medicaid Other | Attending: Emergency Medicine | Admitting: Emergency Medicine

## 2011-12-02 ENCOUNTER — Encounter (HOSPITAL_COMMUNITY): Payer: Self-pay

## 2011-12-02 DIAGNOSIS — H00019 Hordeolum externum unspecified eye, unspecified eyelid: Secondary | ICD-10-CM | POA: Insufficient documentation

## 2011-12-02 DIAGNOSIS — H5710 Ocular pain, unspecified eye: Secondary | ICD-10-CM | POA: Insufficient documentation

## 2011-12-02 DIAGNOSIS — H579 Unspecified disorder of eye and adnexa: Secondary | ICD-10-CM | POA: Insufficient documentation

## 2011-12-02 MED ORDER — ERYTHROMYCIN 5 MG/GM OP OINT: TOPICAL_OINTMENT | OPHTHALMIC | Status: AC

## 2011-12-02 NOTE — ED Notes (Signed)
Dr.Knapp at bedside  

## 2011-12-02 NOTE — ED Provider Notes (Signed)
Pt with left eye stye.  No sign of surrounding infection.  Will refer to ophthalmology for possible excision.  I saw and evaluated the patient, reviewed the resident's note and I agree with the findings and plan.   Celene Kras, MD 12/02/11 [phone]

## 2011-12-02 NOTE — ED Provider Notes (Signed)
History     CSN: [id number]  Arrival date & time 12/02/11  1424   First MD Initiated Contact with Patient 12/02/11 1434      Chief Complaint  Patient presents with  . Eye Pain    (Consider location/radiation/quality/duration/timing/severity/associated sxs/prior treatment) Patient is a 36 y.o. female presenting with eye problem. The history is provided by the patient.  Eye Problem  This is a chronic problem. Episode onset: 4 months ago. The problem occurs constantly. The problem has been gradually worsening (initially improved and has now worsened.). There is pain in the left eye. There was no injury mechanism. The pain is moderate. There is no history of trauma to the eye. Associated symptoms include itching. Pertinent negatives include no decreased vision, no discharge, no double vision, no foreign body sensation, no nausea and no vomiting. Treatments tried: e-mycin ointment 3 months ago.    Past Medical History  Diagnosis Date  . Thyroid disease     told had a thyroid cyst 11/11- needs recheck in 6 months, not done    Past Surgical History  Procedure Date  . Appendectomy     History reviewed. No pertinent family history.  History  Substance Use Topics  . Smoking status: Never Smoker   . Smokeless tobacco: Never Used  . Alcohol Use: No    OB History    Grav Para Term Preterm Abortions TAB SAB Ect Mult Living   5 5 5  0 0 0 0 0 1 6      Review of Systems  Constitutional: Negative for fever.  HENT: Negative for congestion and sore throat.   Eyes: Negative for double vision and discharge.  Respiratory: Negative for cough and shortness of breath.   Cardiovascular: Negative for chest pain.  Gastrointestinal: Negative for nausea, vomiting, abdominal pain and diarrhea.  Genitourinary: Negative for difficulty urinating.  Skin: Positive for itching.  All other systems reviewed and are negative.    Allergies  Review of patient's allergies indicates no known  allergies.  Home Medications   Current Outpatient Rx  Name Route Sig Dispense Refill  . PRENATAL VITAMINS (DIS) PO TABS Oral Take 1 tablet by mouth 1 day or 1 dose. 60 tablet 1    BP 103/64  Pulse 98  Temp(Src) 98 F (36.7 C) (Oral)  Resp 14  SpO2 98%  Breastfeeding? Yes  Physical Exam  Nursing note and vitals reviewed. Constitutional: She is oriented to person, place, and time. She appears well-developed and well-nourished. No distress.  HENT:  Head: Normocephalic and atraumatic.  Mouth/Throat: Oropharynx is clear and moist.  Eyes: Conjunctivae and EOM are normal. Pupils are equal, round, and reactive to light. Left eye exhibits hordeolum (upper lid). Right conjunctiva is not injected. Left conjunctiva is not injected. No scleral icterus.       VA: 20/20 in both eyes  Neck: Neck supple.  Cardiovascular: Normal rate, regular rhythm, normal heart sounds and intact distal pulses.   No murmur heard. Pulmonary/Chest: Effort normal and breath sounds normal. No stridor. No respiratory distress. She has no rales.  Abdominal: Soft. Bowel sounds are normal. She exhibits no distension. There is no tenderness.  Musculoskeletal: Normal range of motion.  Neurological: She is alert and oriented to person, place, and time.  Skin: Skin is warm and dry. No rash noted.  Psychiatric: She has a normal mood and affect. Her behavior is normal.    ED Course  Procedures (including critical care time)  Labs Reviewed - No data to  display No results found.   1. Hordeolum       MDM  36 yo female with hordeolum to left upper eyelid.  No surrounding cellulitis.  No discharge.  No vision changes or swelling.  EOMI.  Initially improved with E mycin ointment prescribed in November, but did not completely heal and now has swollen.  ROS negative.  Recommended warm compresses and prescribed emycin ointment.  DC'd home with ophtho follow up.          Warnell Forester, MD 12/02/11 [phone]

## 2011-12-02 NOTE — ED Notes (Signed)
Pt presents to the er with persistent stye on her left eye. She states that she was seen here and given abx cream and the area just continues to fill back up with fluid and is causing pain to the left eye.

## 2011-12-06 ENCOUNTER — Telehealth: Payer: Self-pay | Admitting: *Deleted

## 2011-12-06 NOTE — Telephone Encounter (Signed)
Message received from pt's husband that she needs a letter stating that she may return to work. I returned the call and spoke w/her husband. I informed him the [facility] will need to have her post partum appt on 12/13/11 to be sure she can return to work. We can provide her with a letter that [employer]. He voiced understanding.

## 2011-12-13 ENCOUNTER — Encounter: Payer: Self-pay | Admitting: Advanced Practice Midwife

## 2011-12-13 ENCOUNTER — Ambulatory Visit (INDEPENDENT_AMBULATORY_CARE_PROVIDER_SITE_OTHER): Payer: Self-pay | Admitting: Advanced Practice Midwife

## 2011-12-13 VITALS — BP 98/71 | HR 92 | Temp 99.1°F | Ht 63.0 in | Wt 193.7 lb

## 2011-12-13 DIAGNOSIS — E079 Disorder of thyroid, unspecified: Secondary | ICD-10-CM

## 2011-12-13 DIAGNOSIS — E041 Nontoxic single thyroid nodule: Secondary | ICD-10-CM

## 2011-12-13 DIAGNOSIS — Z3009 Encounter for other general counseling and advice on contraception: Secondary | ICD-10-CM

## 2011-12-13 LAB — POCT PREGNANCY, URINE: Preg Test, Ur: NEGATIVE

## 2011-12-13 NOTE — Progress Notes (Signed)
Subjective:     Danielle Cohen is a 36 y.o. female who presents for a postpartum visit. She is 8 weeks postpartum following a spontaneous vaginal delivery. I have fully reviewed the prenatal and intrapartum course. The delivery was at term gestational weeks. Outcome: spontaneous vaginal delivery. Postpartum course has been routine. Baby's course has been routine. Baby is feeding by both breast and bottle -  . Bleeding no bleeding. Bowel function is normal. Bladder function is normal. Patient is sexually active. Contraception method is condoms. Postpartum depression screening: negative.  The following portions of the patient's history were reviewed and updated as appropriate: allergies, current medications, past family history, past medical history, past social history, past surgical history and problem list.  Review of Systems A comprehensive review of systems was negative.   Objective:    BP 98/71  Pulse 92  Temp(Src) 99.1 F (37.3 C) (Oral)  Ht 5\' 3"  (1.6 m)  Wt 193 lb 11.2 oz (87.862 kg)  BMI 34.31 kg/m2  Breastfeeding? Yes  General:  alert, cooperative and no distress   Breasts:  inspection negative, no nipple discharge or bleeding, no masses or nodularity palpable  Lungs: clear to auscultation bilaterally  Heart:  regular rate and rhythm, S1, S2 normal, no murmur, click, rub or gallop  Abdomen: soft, non-tender; bowel sounds normal; no masses,  no organomegaly   Vulva:  normal, female circ  Vagina: normal vagina  Cervix:  no cervical motion tenderness  Corpus: normal size, contour, position, consistency, mobility, non-tender  Adnexa:  no mass, fullness, tenderness  Rectal Exam: Not performed.        Results for orders placed in visit on 12/13/11 (from the past 24 hour(s))  POCT PREGNANCY, URINE     Status: Normal   Collection Time   12/13/11  4:06 PM      Component Value Range   Preg Test, Ur NEGATIVE  NEGATIVE     Assessment:    Normal postpartum exam. Pap smear not  done at today's visit.   Plan:    1. Contraception: IUD.Application completed.  2. Abstinence or excellent contraceptive use until Mirena insertion 3. Follow up in: will call when Mirena available and schedule insertion.   or as needed.

## 2011-12-31 ENCOUNTER — Ambulatory Visit: Payer: Self-pay | Admitting: Family

## 2012-01-14 ENCOUNTER — Ambulatory Visit: Payer: Self-pay | Admitting: Advanced Practice Midwife

## 2012-01-31 ENCOUNTER — Ambulatory Visit (INDEPENDENT_AMBULATORY_CARE_PROVIDER_SITE_OTHER): Payer: Self-pay | Admitting: Obstetrics and Gynecology

## 2012-01-31 VITALS — BP 108/65 | HR 89 | Temp 97.3°F | Ht 65.5 in | Wt 199.0 lb

## 2012-01-31 DIAGNOSIS — Z3009 Encounter for other general counseling and advice on contraception: Secondary | ICD-10-CM

## 2012-01-31 DIAGNOSIS — E079 Disorder of thyroid, unspecified: Secondary | ICD-10-CM

## 2012-01-31 LAB — POCT PREGNANCY, URINE: Preg Test, Ur: NEGATIVE

## 2012-01-31 NOTE — Progress Notes (Signed)
Presents for Mirena IUD insertion Para 6 who is 4 months postpartum, breast and bottle feeding, had menses 2 weeks ago and unprotected intercourse 3 days ago.   Explained to pt necessity of no intercourse x 2 wks prior to insertion and will reschedule for 2 wks from now.

## 2012-01-31 NOTE — Patient Instructions (Signed)
No intercourse for 2 ws prior to IUD insertionIntrauterine Device Information An intrauterine device (IUD) is inserted into your uterus and prevents pregnancy. There are 2 types of IUDs available:  Copper IUD. This type of IUD is wrapped in copper wire and is placed inside the uterus. Copper makes the uterus and fallopian tubes produce a fluid that kills sperm. The copper IUD can stay in place for 10 years.   Hormone IUD. This type of IUD contains the hormone progestin (synthetic progesterone). The hormone thickens the cervical mucus and prevents sperm from entering the uterus, and it also thins the uterine lining to prevent implantation of a fertilized egg. The hormone can weaken or kill the sperm that get into the uterus. The hormone IUD can stay in place for 5 years.  Your caregiver will make sure you are a good candidate for a contraceptive IUD. Discuss with your caregiver the possible side effects. ADVANTAGES  It is highly effective, reversible, long-acting, and low maintenance.   There are no estrogen-related side effects.   An IUD can be used when breastfeeding.   It is not associated with weight gain.   It works immediately after insertion.   The copper IUD does not interfere with your female hormones.   The progesterone IUD can make heavy menstrual periods lighter.   The progesterone IUD can be used for 5 years.   The copper IUD can be used for 10 years.  DISADVANTAGES  The progesterone IUD can be associated with irregular bleeding patterns.   The copper IUD can make your menstrual flow heavier and more painful.   You may experience cramping and vaginal bleeding after insertion.  Document Released: 09/21/2004 Document Revised: 10/07/2011 Document Reviewed: 02/20/2011 [facility] Patient Information 2012 [place], [place].

## 2012-02-02 ENCOUNTER — Emergency Department (HOSPITAL_COMMUNITY)
Admission: EM | Admit: 2012-02-02 | Discharge: 2012-02-02 | Disposition: A | Discharge status: Home / Self Care | Payer: Self-pay | Attending: Emergency Medicine | Admitting: Emergency Medicine

## 2012-02-02 ENCOUNTER — Encounter (HOSPITAL_COMMUNITY): Payer: Self-pay | Admitting: Emergency Medicine

## 2012-02-02 ENCOUNTER — Emergency Department (HOSPITAL_COMMUNITY): Payer: Self-pay

## 2012-02-02 DIAGNOSIS — Z21 Asymptomatic human immunodeficiency virus [HIV] infection status: Secondary | ICD-10-CM | POA: Insufficient documentation

## 2012-02-02 DIAGNOSIS — R109 Unspecified abdominal pain: Secondary | ICD-10-CM | POA: Insufficient documentation

## 2012-02-02 DIAGNOSIS — K8020 Calculus of gallbladder without cholecystitis without obstruction: Secondary | ICD-10-CM | POA: Insufficient documentation

## 2012-02-02 DIAGNOSIS — R1011 Right upper quadrant pain: Secondary | ICD-10-CM | POA: Insufficient documentation

## 2012-02-02 LAB — COMPREHENSIVE METABOLIC PANEL
ALT: 8 U/L (ref 0–35)
AST: 15 U/L (ref 0–37)
Albumin: 3.3 g/dL — ABNORMAL LOW (ref 3.5–5.2)
Alkaline Phosphatase: 69 U/L (ref 39–117)
BUN: 13 mg/dL (ref 6–23)
CO2: 25 mEq/L (ref 19–32)
Calcium: 9.4 mg/dL (ref 8.4–10.5)
Chloride: 102 mEq/L (ref 96–112)
Creatinine, Ser: 0.62 mg/dL (ref 0.50–1.10)
GFR calc Af Amer: >90 mL/min (ref >90)
GFR calc non Af Amer: >90 mL/min (ref >90)
Glucose, Bld: 102 mg/dL — ABNORMAL HIGH (ref 70–99)
Potassium: 3.8 mEq/L (ref 3.5–5.1)
Sodium: 137 mEq/L (ref 135–145)
Total Bilirubin: 0.1 mg/dL — ABNORMAL LOW (ref 0.3–1.2)
Total Protein: 7.2 g/dL (ref 6.0–8.3)

## 2012-02-02 LAB — URINALYSIS, ROUTINE W REFLEX MICROSCOPIC
Bilirubin Urine: NEGATIVE
Glucose, UA: NEGATIVE mg/dL
Ketones, ur: NEGATIVE mg/dL
Nitrite: NEGATIVE
Protein, ur: NEGATIVE mg/dL
Specific Gravity, Urine: 1.01 (ref 1.005–1.030)
Urobilinogen, UA: 0.2 mg/dL (ref 0.0–1.0)
pH: 6 (ref 5.0–8.0)

## 2012-02-02 LAB — CBC
HCT: 33.5 % — ABNORMAL LOW (ref 36.0–46.0)
Hemoglobin: 12 g/dL (ref 12.0–15.0)
MCH: 32.6 pg (ref 26.0–34.0)
MCHC: 35.8 g/dL (ref 30.0–36.0)
MCV: 91 fL (ref 78.0–100.0)
Platelets: 439 10*3/uL — ABNORMAL HIGH (ref 150–400)
RBC: 3.68 MIL/uL — ABNORMAL LOW (ref 3.87–5.11)
RDW: 12.6 % (ref 11.5–15.5)
WBC: 6.7 10*3/uL (ref 4.0–10.5)

## 2012-02-02 LAB — DIFFERENTIAL
Basophils Absolute: 0 10*3/uL (ref 0.0–0.1)
Basophils Relative: 1 % (ref 0–1)
Eosinophils Absolute: 0.2 10*3/uL (ref 0.0–0.7)
Eosinophils Relative: 2 % (ref 0–5)
Lymphocytes Relative: 50 % — ABNORMAL HIGH (ref 12–46)
Lymphs Abs: 3.3 10*3/uL (ref 0.7–4.0)
Monocytes Absolute: 0.5 10*3/uL (ref 0.1–1.0)
Monocytes Relative: 8 % (ref 3–12)
Neutro Abs: 2.7 10*3/uL (ref 1.7–7.7)
Neutrophils Relative %: 40 % — ABNORMAL LOW (ref 43–77)

## 2012-02-02 LAB — LIPASE, BLOOD: Lipase: 45 U/L (ref 11–59)

## 2012-02-02 LAB — URINE MICROSCOPIC-ADD ON

## 2012-02-02 MED ORDER — LIDOCAINE VISCOUS 2 % MT SOLN
5.0000 mL | Freq: Once | OROMUCOSAL | Status: AC
  Administered 2012-02-02: 5 mL via OROMUCOSAL
  Filled 2012-02-02: qty 15

## 2012-02-02 MED ORDER — MORPHINE SULFATE 4 MG/ML IJ SOLN
6.0000 mg | Freq: Once | INTRAMUSCULAR | Status: AC
  Administered 2012-02-02: 6 mg via INTRAVENOUS
  Filled 2012-02-02: qty 1
  Filled 2012-02-02: qty 2

## 2012-02-02 MED ORDER — ALUM & MAG HYDROXIDE-SIMETH 200-200-20 MG/5ML PO SUSP
15.0000 mL | Freq: Once | ORAL | Status: AC
  Administered 2012-02-02: 15 mL via ORAL
  Filled 2012-02-02: qty 30

## 2012-02-02 MED ORDER — SODIUM CHLORIDE 0.9 % IV BOLUS (SEPSIS)
1000.0000 mL | Freq: Once | INTRAVENOUS | Status: AC
  Administered 2012-02-02: 1000 mL via INTRAVENOUS

## 2012-02-02 MED ORDER — OXYCODONE-ACETAMINOPHEN 5-325 MG PO TABS: 1.0000 | ORAL_TABLET | ORAL | Status: AC | PRN

## 2012-02-02 MED ORDER — FAMOTIDINE IN NACL 20-0.9 MG/50ML-% IV SOLN
20.0000 mg | Freq: Once | INTRAVENOUS | Status: AC
  Administered 2012-02-02: 20 mg via INTRAVENOUS
  Filled 2012-02-02: qty 50

## 2012-02-02 NOTE — ED Notes (Signed)
PT. REPORTS MID ABDOMINAL PAIN ONSET 12 MIDNIGHT , DENIES NAUSEA , VOMITTING OR DIARRHEA , NO FEVER OR CHILLS.

## 2012-02-02 NOTE — ED Notes (Signed)
Pt comes to the ED complaining of generalized abdominal pain that radiates to right and left flank.  Pain has been going on for the past two days but got worse this morning around 12am this morning while she was sleeping.  Pt denies n/v/d or dysuria.

## 2012-02-02 NOTE — ED Notes (Signed)
Family at bedside. 

## 2012-02-02 NOTE — ED Notes (Signed)
Patient transported to Ultrasound 

## 2012-02-11 NOTE — ED Provider Notes (Signed)
History    36yf with abdominal pain. Gradual onset around midnight while sleeping. RUQ with radiation to R flank. Constant. Colicy. No appreciable exacerbating or relieving factors. No fever, sweats or chills. No n/v. No urinary complaints. No hx of simialr symptoms. ~4 months post partum.  CSN: [id number]  Arrival date & time 02/02/12  0200   First MD Initiated Contact with Patient 02/02/12 0241      Chief Complaint  Patient presents with  . Abdominal Pain    (Consider location/radiation/quality/duration/timing/severity/associated sxs/prior treatment) HPI  Past Medical History  Diagnosis Date  . Thyroid disease     told had a thyroid cyst 11/11- needs recheck in 6 months, not done  . Immune deficiency disorder   . HIV (human immunodeficiency virus infection)     Past Surgical History  Procedure Date  . Appendectomy     History reviewed. No pertinent family history.  History  Substance Use Topics  . Smoking status: Never Smoker   . Smokeless tobacco: Never Used  . Alcohol Use: No    OB History    Grav Para Term Preterm Abortions TAB SAB Ect Mult Living   5 5 5  0 0 0 0 0 1 6      Review of Systems   Review of symptoms negative unless otherwise noted in HPI.   Allergies  Review of patient's allergies indicates no known allergies.  Home Medications   Current Outpatient Rx  Name Route Sig Dispense Refill  . PRENATAL VITAMINS (DIS) PO TABS Oral Take 1 tablet by mouth 1 day or 1 dose. 60 tablet 1  . OXYCODONE-ACETAMINOPHEN 5-325 MG PO TABS Oral Take 1-2 tablets by mouth every 4 (four) hours as needed for pain. 20 tablet 0    BP 103/59  Pulse 73  Temp(Src) 97.9 F (36.6 C) (Oral)  Resp 17  SpO2 100%  Breastfeeding? Unknown  Physical Exam  Nursing note and vitals reviewed. Constitutional: She appears well-developed and well-nourished.       Laying in bed. Uncomfortable appearing.  HENT:  Head: Normocephalic and atraumatic.  Eyes: Conjunctivae are  normal. Pupils are equal, round, and reactive to light. Right eye exhibits no discharge. Left eye exhibits no discharge.  Neck: Neck supple.  Cardiovascular: Normal rate, regular rhythm and normal heart sounds.  Exam reveals no gallop and no friction rub.   No murmur heard. Pulmonary/Chest: Effort normal and breath sounds normal. No respiratory distress.  Abdominal: Soft. She exhibits no distension. There is tenderness. There is guarding.       RUQ tenderness with guarding. No rebound. No mass. No distension.  Genitourinary:       No cva tenderness.  Musculoskeletal: She exhibits no edema and no tenderness.  Neurological: She is alert.  Skin: Skin is warm and dry. She is not diaphoretic.  Psychiatric: She has a normal mood and affect. Her behavior is normal. Thought content normal.    ED Course  Procedures (including critical care time)  Labs Reviewed  URINALYSIS, ROUTINE W REFLEX MICROSCOPIC - Abnormal; Notable for the following:    APPearance CLOUDY (*)    Hgb urine dipstick TRACE (*)    Leukocytes, UA SMALL (*)    All other components within normal limits  CBC - Abnormal; Notable for the following:    RBC 3.68 (*)    HCT 33.5 (*)    Platelets 439 (*)    All other components within normal limits  DIFFERENTIAL - Abnormal; Notable for the following:  Neutrophils Relative 40 (*)    Lymphocytes Relative 50 (*)    All other components within normal limits  COMPREHENSIVE METABOLIC PANEL - Abnormal; Notable for the following:    Glucose, Bld 102 (*)    Albumin 3.3 (*)    Total Bilirubin 0.1 (*)    All other components within normal limits  URINE MICROSCOPIC-ADD ON - Abnormal; Notable for the following:    Squamous Epithelial / LPF MANY (*)    Bacteria, UA FEW (*)    All other components within normal limits  POCT PREGNANCY, URINE  LIPASE, BLOOD  LAB REPORT - SCANNED   No results found.  US Abdomen Complete  02/02/2012  *RADIOLOGY REPORT*  Clinical Data:  Right lower  quadrant epigastric pain.  4 months postpartum.  COMPLETE ABDOMINAL ULTRASOUND  Comparison:  CT abdomen 03/12/2008  Findings:  Gallbladder:  Multiple stones in the dependent gallbladder. 2 cm diameter stone in the gallbladder neck.No gallbladder wall thickening.  Murphy's sign is negative.  Common bile duct:  Nondilated, measures 4.6 mm.  Liver:  No focal lesion identified.  Within normal limits in parenchymal echogenicity.  IVC:  Appears normal.  Pancreas:  Limited visualization due to overlying bowel gas.  Spleen:  Spleen length measures 9.8 cm.  Normal homogeneous echotexture.  Right Kidney:  Right kidney measures 10.3 cm length.  No hydronephrosis.  Left Kidney:  Left kidney measures 9.7 cm length.  No hydronephrosis.  Abdominal aorta:  No aneurysm identified.  IMPRESSION: Cholelithiasis.  Original Report Authenticated By: Marlon Pel, M.D.   1. Cholelithiasis   2. Abdominal pain       MDM  36yF with RUQ pain. [place] significant for cholelithiasis. No evidence of cholecystitis. Plan symptomatic tx and surgical referral. Return precautions discussed.       Raeford Razor, MD 02/11/12 1427

## 2012-02-28 ENCOUNTER — Ambulatory Visit (INDEPENDENT_AMBULATORY_CARE_PROVIDER_SITE_OTHER): Payer: Self-pay | Admitting: Obstetrics and Gynecology

## 2012-02-28 ENCOUNTER — Encounter: Payer: Self-pay | Admitting: Obstetrics and Gynecology

## 2012-02-28 VITALS — BP 104/75 | HR 99 | Temp 99.9°F | Ht 64.0 in | Wt 199.0 lb

## 2012-02-28 DIAGNOSIS — Z30430 Encounter for insertion of intrauterine contraceptive device: Secondary | ICD-10-CM

## 2012-02-28 DIAGNOSIS — Z01812 Encounter for preprocedural laboratory examination: Secondary | ICD-10-CM

## 2012-02-28 DIAGNOSIS — Z975 Presence of (intrauterine) contraceptive device: Secondary | ICD-10-CM

## 2012-02-28 LAB — POCT PREGNANCY, URINE: Preg Test, Ur: NEGATIVE

## 2012-02-28 MED ORDER — LEVONORGESTREL 20 MCG/24HR IU IUD
INTRAUTERINE_SYSTEM | Freq: Once | INTRAUTERINE | Status: AC
  Administered 2012-02-28: 1 via INTRAUTERINE

## 2012-02-28 NOTE — Patient Instructions (Signed)
Intrauterine Device Insertion Most often, an intrauterine device (IUD) is inserted into the uterus to prevent pregnancy. There are 2 types of IUDs available:  Copper IUD. This type of IUD creates an environment that is not favorable to sperm survival. The mechanism of action of the copper IUD is not known for certain. It can stay in place for 10 years.   Hormone IUD. This type of IUD contains the hormone progestin (synthetic progesterone). The progestin thickens the cervical mucus and prevents sperm from entering the uterus, and it also thins the uterine lining. There is no evidence that the hormone IUD prevents implantation. The hormone IUD can stay in place for up to 5 years.  An IUD is the most cost-effective birth control if left in place for the full duration. It may be removed at any time. LET YOUR CAREGIVER KNOW ABOUT:  Sensitivity to metals.   Medicines taken including herbs, eyedrops, over-the-counter medicines, and creams.   Use of steroids (by mouth or creams).   Previous problems with anesthetics or numbing medicine.   Previous gynecological surgery.   History of blood clots or clotting disorders.   Possibility of pregnancy.   Menstrual irregularities.   Concerns regarding unusual vaginal discharge or odors.   Previous experience with an IUD.   Other health problems.  RISKS AND COMPLICATIONS  Accidental puncture (perforation) of the uterus.   Accidental placement of the IUD either in the muscle layer of the uterus (myometrium) or outside the uterus. If this happen, the IUD can be found essentially floating around the bowels. When this happens, the IUD must be taken out surgically.   The IUD may fall out of the uterus (expulsion). This is more common in women who have recently had a child.    Pregnancy in the fallopian tube (ectopic).  BEFORE THE PROCEDURE  Schedule the IUD insertion for when you will have your menstrual period or right after, to make sure you  are not pregnant. Placement of the IUD is better tolerated shortly after a menstrual cycle.   You may need to take tests or be examined to make sure you are not pregnant.   You may be required to take a pregnancy test.   You may be required to get checked for sexually transmitted infections (STIs) prior to placement. Placing an IUD in someone who has an infection can make an infection worse.   You may be given a pain reliever to take 1 or 2 hours before the procedure.   An exam will be performed to determine the size and position of your uterus.   Ask your caregiver about changing or stopping your regular medicines.  PROCEDURE   A tool (speculum) is placed in the vagina. This allows your caregiver to see the lower part of the uterus (cervix).   The cervix is prepped with a medicine that lowers the risk of infection.   You may be given a medicine to numb each side of the cervix (intracervical or paracervical block). This is used to block and control any discomfort with insertion.   A tool (uterine sound) is inserted into the uterus to determine the length of the uterine cavity and the direction the uterus may be tilted.   A slim instrument (IUD inserter) is inserted through the cervical canal and into your uterus.   The IUD is placed in the uterine cavity and the insertion device is removed.   The nylon string that is attached to the IUD, and used for   eventual IUD removal, is trimmed. It is trimmed so that it lays high in the vagina, just outside the cervix.  AFTER THE PROCEDURE  You may have bleeding after the procedure. This is normal. It varies from light spotting for a few days to menstrual-like bleeding.   You may have mild cramping.   Practice checking the string coming out of the cervix to make sure the IUD remains in the uterus. If you cannot feel the string, you should schedule a "string check" with your caregiver.   If you had a hormone IUD inserted, expect that your  period may be lighter or nonexistent within a year's time (though this is not always the case). There may be delayed fertility with the hormone IUD as a result of its progesterone effect. When you are ready to become pregnant, it is suggested to have the IUD removed up to 1 year in advance.   Yearly exams are advised.  Document Released: 06/16/2011 Document Revised: 10/07/2011 Document Reviewed: 06/16/2011 [employer] Patient Information 2012 [employer]. 

## 2012-02-28 NOTE — Progress Notes (Signed)
Addended by: Sherre Lain A on: 02/28/2012 03:48 PM   Modules accepted: Orders

## 2012-02-28 NOTE — Progress Notes (Addendum)
Subjective:  36 year old para 6 is now 5 months postpartum and is here for IUD insertion. She's had the Mirena IUD in the past and was pleased with it. She's been using condoms and has been abstinent for 17 days.  Patient identified, informed consent performed, signed copy in chart, time out was performed.  Risks of procedure reviewed.   Objective:  Urine pregnancy test negative.  Bimanual exam: Uterus midposition, ULNS, mobile, NT  Procedure Note:   Speculum placed in the vagina.  Cervix visualized.  Cleaned with Betadine x 2.  Grasped anteriourly with a single tooth tenaculum.  Uterus sounded to 10.5.  Mirena IUD placed per manufacturer's recommendations.  Strings trimmed to 2.5 cm.  She had some oozing from 10 and 2:00 on the cervix for tenaculum was placed that did not respond to pressure with Fox swabs but stopped with application of silver nitrate stick.  Patient given post procedure instructions and Mirena care card with expiration date.  Patient is asked to check IUD strings periodically and follow up in 4-6 weeks for IUD check.

## 2012-03-03 ENCOUNTER — Ambulatory Visit (INDEPENDENT_AMBULATORY_CARE_PROVIDER_SITE_OTHER): Payer: PRIVATE HEALTH INSURANCE | Admitting: General Surgery

## 2012-03-03 ENCOUNTER — Encounter (INDEPENDENT_AMBULATORY_CARE_PROVIDER_SITE_OTHER): Payer: Self-pay | Admitting: General Surgery

## 2012-03-03 VITALS — BP 102/64 | HR 98 | Temp 98.9°F | Resp 14 | Ht 64.0 in | Wt 202.2 lb

## 2012-03-03 DIAGNOSIS — K8020 Calculus of gallbladder without cholecystitis without obstruction: Secondary | ICD-10-CM | POA: Insufficient documentation

## 2012-03-03 MED ORDER — OXYCODONE-ACETAMINOPHEN 5-325 MG PO TABS: 1.0000 | ORAL_TABLET | ORAL | Status: DC | PRN

## 2012-03-03 NOTE — Progress Notes (Signed)
Patient ID: HETVI SHAWHAN, female   DOB: [birth year], 36 y.o.   MRN: [id number]  Chief Complaint  Patient presents with  . Abdominal Pain    new pt- eval GB w/ stones  30  HPI Danielle Cohen is a 36 y.o. female.   HPI 36 year old female referred by Dr. Juleen China for evaluation of abdominal pain. The patient has had intermittent abdominal pain for about a month. It occurs intermittently. She states it is mainly occurs at night. It was gently last around 45 minutes to an hour. It is not associated with any nausea or vomiting. It is mainly in her epigastric and left upper quadrant. She describes it as a deep pain. She rates it as an 8-9/10. She denies any jaundice, acholic stools, NSAID use, weight loss, melena or hematochezia. She denies any diarrhea or constipation. She cannot attribute it to any particular foods. She went to the ER for evaluation where an abdominal ultrasound was performed which demonstrated multiple gallstones. She gets relief with the discomfort by taking the Percocet. She denies any reflux Past Medical History  Diagnosis Date  . Thyroid disease     told had a thyroid cyst 11/11- needs recheck in 6 months, not done  . Immune deficiency disorder   . HIV (human immunodeficiency virus infection)     ?? HIV ab was NR on 07/2011  . Gallstones     Past Surgical History  Procedure Date  . Appendectomy     History reviewed. No pertinent family history.  Social History History  Substance Use Topics  . Smoking status: Never Smoker   . Smokeless tobacco: Never Used  . Alcohol Use: No    No Known Allergies  Current Outpatient Prescriptions  Medication Sig Dispense Refill  . oxyCODONE-acetaminophen (PERCOCET) 5-325 MG per tablet Take 1-2 tablets by mouth every 4 (four) hours as needed.        Review of Systems Review of Systems  Constitutional: Negative for fever, activity change, appetite change and unexpected weight change.  HENT: Negative for nosebleeds and neck  stiffness.   Eyes: Negative for photophobia and visual disturbance.  Respiratory: Negative for chest tightness, shortness of breath and wheezing.   Cardiovascular: Negative for chest pain, palpitations and leg swelling.  Gastrointestinal:       See HPI  Genitourinary: Negative for dysuria, urgency, hematuria and difficulty urinating.  Musculoskeletal: Negative for back pain.  Skin: Negative for color change and pallor.  Neurological: Negative for tremors, seizures, syncope, facial asymmetry and light-headedness.  Hematological: Negative for adenopathy.  Psychiatric/Behavioral: Negative for behavioral problems.    Blood pressure 102/64, pulse 98, temperature 98.9 F (37.2 C), temperature source Temporal, resp. rate 14, height 5\' 4"  (1.626 m), weight 202 lb 3.2 oz (91.717 kg), last menstrual period 02/13/2012.  Physical Exam Physical Exam  Vitals reviewed. Constitutional: She is oriented to person, place, and time. She appears well-developed and well-nourished. No distress.  HENT:  Head: Normocephalic and atraumatic.  Eyes: Conjunctivae are normal. No scleral icterus.  Neck: Normal range of motion. Neck supple. No tracheal deviation present. No thyromegaly present.  Cardiovascular: Normal rate, regular rhythm and normal heart sounds.   Pulmonary/Chest: Effort normal and breath sounds normal. No respiratory distress. She has no wheezes.  Abdominal: Soft. Bowel sounds are normal. She exhibits no distension. There is no tenderness. There is no rebound.    Musculoskeletal: Normal range of motion. She exhibits no edema and no tenderness.  Lymphadenopathy:    She has no  cervical adenopathy.  Neurological: She is alert and oriented to person, place, and time. No cranial nerve deficit. She exhibits normal muscle tone. Coordination normal.  Skin: Skin is warm and dry. No rash noted. She is not diaphoretic. No erythema.  Psychiatric: She has a normal mood and affect. Her behavior is normal.  Thought content normal.    Data Reviewed ED note COMPLETE ABDOMINAL ULTRASOUND 02/02/12 Comparison: CT abdomen 03/12/2008  Findings:   Gallbladder: Multiple stones in the dependent gallbladder. 2 cm  diameter stone in the gallbladder neck.No gallbladder wall  thickening. Murphy's sign is negative.  Common bile duct: Nondilated, measures 4.6 mm.  Liver: No focal lesion identified. Within normal limits in  parenchymal echogenicity.  IVC: Appears normal.  Pancreas: Limited visualization due to overlying bowel gas.  Spleen: Spleen length measures 9.8 cm. Normal homogeneous  echotexture.  Right Kidney: Right kidney measures 10.3 cm length. No  hydronephrosis.  Left Kidney: Left kidney measures 9.7 cm length. No  hydronephrosis.  Abdominal aorta: No aneurysm identified.   IMPRESSION:  Cholelithiasis.  NP note  HIV Ab test from 07/2011 was Nonreactive Assessment    Symptomatic cholelithiasis    Plan    We discussed gallbladder disease. The patient was given Agricultural engineer. We discussed non-operative and operative management. We discussed the signs and symptoms of acute cholecystitis.  I discussed laparoscopic cholecystectomy with IOC in detail.  The patient was given educational material as well as diagrams detailing the procedure.  We discussed the risks and benefits of a laparoscopic cholecystectomy including, but not limited to bleeding, infection, injury to surrounding structures such as the intestine or liver, bile leak, retained gallstones, need to convert to an open procedure, prolonged diarrhea, blood clots such as  DVT, common bile duct injury, anesthesia risks, and possible need for additional procedures.  We discussed the typical post-operative recovery course. I explained that the likelihood of improvement of their symptoms is good.  She will be scheduled for a Laparoscopic Cholecystectomy with IOC  Mary Sella. Andrey Campanile, MD, FACS General, Bariatric, & Minimally Invasive  Surgery [facility], [place]        [facility] M 03/03/2012, 9:37 AM

## 2012-03-03 NOTE — Patient Instructions (Signed)
Cholelithiasis Cholelithiasis (also called gallstones) is a form of gallbladder disease where gallstones form in your gallbladder. The gallbladder is a non-essential organ that stores bile made in the liver, which helps digest fats. Gallstones begin as small crystals and slowly grow into stones. Gallstone pain occurs when the gallbladder spasms, and a gallstone is blocking the duct. Pain can also occur when a stone passes out of the duct.  Women are more likely to develop gallstones than men. Other factors that increase the risk of gallbladder disease are:  Having multiple pregnancies. Physicians sometimes advise removing diseased gallbladders before future pregnancies.   Obesity.   Diets heavy in fried foods and fat.   Increasing age (older than 60).   Prolonged use of medications containing female hormones.   Diabetes mellitus.   Rapid weight loss.   Family history of gallstones (heredity).  SYMPTOMS  Feeling sick to your stomach (nauseous).   Abdominal pain.   Yellowing of the skin (jaundice).   Sudden pain. It may persist from several minutes to several hours.   Worsening pain with deep breathing or when jarred.   Fever.   Tenderness to the touch.  In some cases, when gallstones do not move into the bile duct, people have no pain or symptoms. These are called "silent" gallstones. TREATMENT In severe cases, emergency surgery may be required. HOME CARE INSTRUCTIONS   Only take over-the-counter or prescription medicines for pain, discomfort, or fever as directed by your caregiver.   Follow a low-fat diet until seen again. Fat causes the gallbladder to contract, which can result in pain.   Follow up as instructed. Attacks are almost always recurrent and surgery is usually required for permanent treatment.  SEEK IMMEDIATE MEDICAL CARE IF:   Your pain increases and is not controlled by medications.   You have an oral temperature above 102 F (38.9 C), not controlled by  medication.   You develop nausea and vomiting.  MAKE SURE YOU:   Understand these instructions.   Will watch your condition.   Will get help right away if you are not doing well or get worse.  Document Released: 10/14/2005 Document Revised: 10/07/2011 Document Reviewed: 12/17/2010 Exit[employer] Patient Information 2012 [employer]. 

## 2012-03-18 ENCOUNTER — Emergency Department (HOSPITAL_COMMUNITY)
Admission: EM | Admit: 2012-03-18 | Discharge: 2012-03-18 | Disposition: A | Discharge status: Home / Self Care | Payer: Self-pay | Attending: Emergency Medicine | Admitting: Emergency Medicine

## 2012-03-18 ENCOUNTER — Encounter (HOSPITAL_COMMUNITY): Payer: Self-pay | Admitting: *Deleted

## 2012-03-18 DIAGNOSIS — Z21 Asymptomatic human immunodeficiency virus [HIV] infection status: Secondary | ICD-10-CM | POA: Insufficient documentation

## 2012-03-18 DIAGNOSIS — E079 Disorder of thyroid, unspecified: Secondary | ICD-10-CM | POA: Insufficient documentation

## 2012-03-18 DIAGNOSIS — J069 Acute upper respiratory infection, unspecified: Secondary | ICD-10-CM | POA: Insufficient documentation

## 2012-03-18 LAB — RAPID STREP SCREEN (MED CTR MEBANE ONLY): Streptococcus, Group A Screen (Direct): NEGATIVE

## 2012-03-18 MED ORDER — MAGIC MOUTHWASH
5.0000 mL | Freq: Once | ORAL | Status: AC
  Administered 2012-03-18: 5 mL via ORAL
  Filled 2012-03-18: qty 5

## 2012-03-18 MED ORDER — MAGIC MOUTHWASH W/LIDOCAINE: 5.0000 mL | Freq: Three times a day (TID) | ORAL | Status: DC | PRN

## 2012-03-18 MED ORDER — CETIRIZINE-PSEUDOEPHEDRINE ER 5-120 MG PO TB12: 1.0000 | ORAL_TABLET | Freq: Every day | ORAL | Status: DC

## 2012-03-18 NOTE — ED Provider Notes (Signed)
Medical screening examination/treatment/procedure(s) were performed by non-physician practitioner and as supervising physician I was immediately available for consultation/collaboration.  Geoffery Lyons, MD 03/18/12 [phone]

## 2012-03-18 NOTE — ED Provider Notes (Signed)
History     CSN: [id number]  Arrival date & time 03/18/12  4098   First MD Initiated Contact with Patient 03/18/12 0630      Chief Complaint  Patient presents with  . Sore Throat    (Consider location/radiation/quality/duration/timing/severity/associated sxs/prior treatment) HPI  36 year old female with history of HIV presents complaining of sore throat. Patient states for the past week she has been having cold symptoms including headache, runny nose, sore throat, earaches, sneezing, and cough productive at night. Symptom was gradual in onset, and persistent. Symptoms is not associated with fever, chills, chest pain, shortness of breath, abdominal pain, nausea, vomiting, diarrhea vomiting or urinary symptoms, or rash.  Patient denies change in voice, and she does not know her CD4 count. She has tried over-the-counter medication without adequate relief. She is up-to-date with immunization  Past Medical History  Diagnosis Date  . Thyroid disease     told had a thyroid cyst 11/11- needs recheck in 6 months, not done  . Immune deficiency disorder   . HIV (human immunodeficiency virus infection)     ?? HIV ab was NR on 07/2011  . Gallstones     Past Surgical History  Procedure Date  . Appendectomy     History reviewed. No pertinent family history.  History  Substance Use Topics  . Smoking status: Never Smoker   . Smokeless tobacco: Never Used  . Alcohol Use: No    OB History    Grav Para Term Preterm Abortions TAB SAB Ect Mult Living   5 5 5  0 0 0 0 0 1 6      Review of Systems  All other systems reviewed and are negative.    Allergies  Review of patient's allergies indicates no known allergies.  Home Medications  No current outpatient prescriptions on file.  BP 114/71  Pulse 97  Temp(Src) 98.2 F (36.8 C) (Oral)  Resp 16  SpO2 98%  LMP 02/13/2012  Physical Exam  Nursing note and vitals reviewed. Constitutional: She appears well-developed and  well-nourished. No distress.       Awake, alert, nontoxic appearance  HENT:  Head: Atraumatic.  Right Ear: Tympanic membrane is bulging. Tympanic membrane is not erythematous.  Left Ear: Tympanic membrane is bulging. Tympanic membrane is not erythematous.  Nose: No mucosal edema or rhinorrhea.  Mouth/Throat: Uvula is midline and mucous membranes are normal. Posterior oropharyngeal erythema present. No oropharyngeal exudate, posterior oropharyngeal edema or tonsillar abscesses.  Eyes: Conjunctivae are normal. Right eye exhibits no discharge. Left eye exhibits no discharge.  Neck: Neck supple.  Cardiovascular: Normal rate and regular rhythm.   Pulmonary/Chest: Effort normal. No respiratory distress. She exhibits no tenderness.  Abdominal: Soft. There is no tenderness. There is no rebound.  Musculoskeletal: She exhibits no tenderness.  Neurological: She is alert.  Skin: Skin is warm. No rash noted.  Psychiatric: She has a normal mood and affect.    ED Course  Procedures (including critical care time)  Labs Reviewed - No data to display No results found.   No diagnosis found.  Results for orders placed during the hospital encounter of 03/18/12  RAPID STREP SCREEN      Component Value Range   Streptococcus, Group A Screen (Direct) NEGATIVE  NEGATIVE    No results found.    MDM  Cold sxs.  Does not meet centor criteria.  No evidence of Thrush.  HIV Ab test from 07/2011 was Nonreactive. No evidence of PTA or Ludwig's angina  7:18 AM A strep test is negative. Symptoms is suggestive of URI. She is afebrile her vital signs stable. We'll prescribe magic mouthwash and zyrtec.  Pt voice understanding and agrees with plan.        Fayrene Helper, PA-C 03/18/12 [phone]

## 2012-03-18 NOTE — ED Notes (Signed)
Pt states sore throat and ears hurt that has been going on for about a week, pt states a little cough, "allergies acting up" pt states painful swallowing.

## 2012-03-31 ENCOUNTER — Encounter (HOSPITAL_COMMUNITY): Payer: Self-pay

## 2012-03-31 NOTE — Pre-Procedure Instructions (Signed)
[address] Hilaria E Jentsch  03/31/2012   Your procedure is scheduled on:  04/06/2012  Report to [facility] at 8:00 AM.  Call this number if you have problems the morning of surgery: [phone]   Remember:   Do not eat food:After Midnight.  04/05/2012  May have clear liquids: up to 4 Hours before arrival.  Clear liquids include soda, tea, black coffee, apple or grape juice, broth.  Take these medicines the morning of surgery with A SIP OF WATER: NOTHING   Do not wear jewelry, make-up or nail polish.  Do not wear lotions, powders, or perfumes. You may wear deodorant.  Do not shave 48 hours prior to surgery. Men may shave face and neck.  Do not bring valuables to the hospital.  Contacts, dentures or bridgework may not be worn into surgery.  Leave suitcase in the car. After surgery it may be brought to your room.  For patients admitted to the hospital, checkout time is 11:00 AM the day of discharge.   Patients discharged the day of surgery will not be allowed to drive home.  Name and phone number of your driver: /w family  Special Instructions: CHG Shower Use Special Wash: 1/2 bottle night before surgery and 1/2 bottle morning of surgery.   Please read over the following fact sheets that you were given: Pain Booklet, Coughing and Deep Breathing, MRSA Information and Surgical Site Infection Prevention

## 2012-04-01 DIAGNOSIS — K819 Cholecystitis, unspecified: Secondary | ICD-10-CM

## 2012-04-01 HISTORY — DX: Cholecystitis, unspecified: K81.9

## 2012-04-03 ENCOUNTER — Encounter (HOSPITAL_COMMUNITY)
Admission: RE | Admit: 2012-04-03 | Discharge: 2012-04-03 | Disposition: A | Discharge status: Home / Self Care | Payer: Self-pay | Source: Ambulatory Visit | Attending: General Surgery | Admitting: General Surgery

## 2012-04-03 LAB — COMPREHENSIVE METABOLIC PANEL
ALT: 9 U/L (ref 0–35)
Albumin: 3.5 g/dL (ref 3.5–5.2)
Alkaline Phosphatase: 51 U/L (ref 39–117)
Calcium: 9.4 mg/dL (ref 8.4–10.5)
GFR calc Af Amer: >90 mL/min (ref >90)
Glucose, Bld: 106 mg/dL — ABNORMAL HIGH (ref 70–99)
Potassium: 3.8 mEq/L (ref 3.5–5.1)
Sodium: 137 mEq/L (ref 135–145)
Total Protein: 7.4 g/dL (ref 6.0–8.3)

## 2012-04-03 LAB — DIFFERENTIAL
Eosinophils Relative: 6 % — ABNORMAL HIGH (ref 0–5)
Lymphocytes Relative: 39 % (ref 12–46)
Lymphs Abs: 2.2 10*3/uL (ref 0.7–4.0)
Monocytes Relative: 10 % (ref 3–12)

## 2012-04-03 LAB — HCG, SERUM, QUALITATIVE: Preg, Serum: NEGATIVE

## 2012-04-03 LAB — CBC
MCH: 30.1 pg (ref 26.0–34.0)
MCHC: 33.6 g/dL (ref 30.0–36.0)
Platelets: 481 10*3/uL — ABNORMAL HIGH (ref 150–400)
RDW: 13 % (ref 11.5–15.5)

## 2012-04-03 LAB — SURGICAL PCR SCREEN
MRSA, PCR: NEGATIVE
Staphylococcus aureus: NEGATIVE

## 2012-04-03 NOTE — Progress Notes (Signed)
Per lab PCR screen had to be re-run. Still has approx 1 hour left before result available. Will place for follow-up tomorrow.

## 2012-04-05 MED ORDER — DEXTROSE 5 % IV SOLN
2.0000 g | INTRAVENOUS | Status: AC
  Administered 2012-04-06: 2 g via INTRAVENOUS
  Filled 2012-04-05: qty 2

## 2012-04-06 ENCOUNTER — Encounter (HOSPITAL_COMMUNITY): Payer: Self-pay | Admitting: Certified Registered"

## 2012-04-06 ENCOUNTER — Ambulatory Visit (HOSPITAL_COMMUNITY)
Admission: RE | Admit: 2012-04-06 | Discharge: 2012-04-07 | Disposition: A | Discharge status: Home / Self Care | Payer: Self-pay | Source: Ambulatory Visit | Attending: General Surgery | Admitting: General Surgery

## 2012-04-06 ENCOUNTER — Ambulatory Visit (HOSPITAL_COMMUNITY): Payer: Self-pay | Admitting: Certified Registered"

## 2012-04-06 ENCOUNTER — Ambulatory Visit (HOSPITAL_COMMUNITY): Payer: Self-pay

## 2012-04-06 ENCOUNTER — Encounter (HOSPITAL_COMMUNITY): Payer: Self-pay | Admitting: *Deleted

## 2012-04-06 ENCOUNTER — Encounter (HOSPITAL_COMMUNITY)
Admission: RE | Disposition: A | Discharge status: Home / Self Care | Payer: Self-pay | Source: Ambulatory Visit | Attending: General Surgery

## 2012-04-06 ENCOUNTER — Encounter (HOSPITAL_COMMUNITY): Payer: Self-pay | Admitting: General Practice

## 2012-04-06 DIAGNOSIS — K8010 Calculus of gallbladder with chronic cholecystitis without obstruction: Secondary | ICD-10-CM

## 2012-04-06 DIAGNOSIS — K8020 Calculus of gallbladder without cholecystitis without obstruction: Secondary | ICD-10-CM

## 2012-04-06 DIAGNOSIS — Z01812 Encounter for preprocedural laboratory examination: Secondary | ICD-10-CM | POA: Insufficient documentation

## 2012-04-06 HISTORY — PX: CHOLECYSTECTOMY: SHX55

## 2012-04-06 HISTORY — DX: Cholecystitis, unspecified: K81.9

## 2012-04-06 SURGERY — LAPAROSCOPIC CHOLECYSTECTOMY WITH INTRAOPERATIVE CHOLANGIOGRAM
Anesthesia: General | Site: Abdomen | Wound class: Contaminated

## 2012-04-06 MED ORDER — KCL IN DEXTROSE-NACL 20-5-0.45 MEQ/L-%-% IV SOLN
INTRAVENOUS | Status: DC
  Administered 2012-04-06: 15:00:00 via INTRAVENOUS
  Filled 2012-04-06 (×3): qty 1000

## 2012-04-06 MED ORDER — BUPIVACAINE-EPINEPHRINE 0.25% -1:200000 IJ SOLN
INTRAMUSCULAR | Status: DC | PRN
  Administered 2012-04-06: 10 mL

## 2012-04-06 MED ORDER — ONDANSETRON HCL 4 MG PO TABS: 4.0000 mg | ORAL_TABLET | Freq: Four times a day (QID) | ORAL | Status: DC | PRN

## 2012-04-06 MED ORDER — ROCURONIUM BROMIDE 100 MG/10ML IV SOLN
INTRAVENOUS | Status: DC | PRN
  Administered 2012-04-06 (×2): 5 mg via INTRAVENOUS
  Administered 2012-04-06: 40 mg via INTRAVENOUS

## 2012-04-06 MED ORDER — LIDOCAINE HCL 4 % MT SOLN
OROMUCOSAL | Status: DC | PRN
  Administered 2012-04-06: 4 mL via TOPICAL

## 2012-04-06 MED ORDER — MIDAZOLAM HCL 5 MG/5ML IJ SOLN
INTRAMUSCULAR | Status: DC | PRN
  Administered 2012-04-06: 2 mg via INTRAVENOUS

## 2012-04-06 MED ORDER — HYDROMORPHONE HCL PF 1 MG/ML IJ SOLN
INTRAMUSCULAR | Status: AC
Start: 2012-04-06 — End: 2012-04-06
  Filled 2012-04-06: qty 1

## 2012-04-06 MED ORDER — LACTATED RINGERS IV SOLN
INTRAVENOUS | Status: DC | PRN
  Administered 2012-04-06 (×2): via INTRAVENOUS

## 2012-04-06 MED ORDER — ACETAMINOPHEN 325 MG PO TABS: 650.0000 mg | ORAL_TABLET | ORAL | Status: DC | PRN

## 2012-04-06 MED ORDER — PHENYLEPHRINE HCL 10 MG/ML IJ SOLN
INTRAMUSCULAR | Status: DC | PRN
  Administered 2012-04-06: 40 ug via INTRAVENOUS

## 2012-04-06 MED ORDER — LACTATED RINGERS IV SOLN
INTRAVENOUS | Status: DC
  Administered 2012-04-06: 09:00:00 via INTRAVENOUS

## 2012-04-06 MED ORDER — MORPHINE SULFATE 2 MG/ML IJ SOLN: 1.0000 mg | INTRAMUSCULAR | Status: DC | PRN

## 2012-04-06 MED ORDER — SODIUM CHLORIDE 0.9 % IV SOLN: 250.0000 mL | INTRAVENOUS | Status: DC | PRN

## 2012-04-06 MED ORDER — CHLORHEXIDINE GLUCONATE 4 % EX LIQD: 1.0000 "application " | Freq: Once | CUTANEOUS | Status: DC

## 2012-04-06 MED ORDER — ACETAMINOPHEN 650 MG RE SUPP: 650.0000 mg | RECTAL | Status: DC | PRN

## 2012-04-06 MED ORDER — OXYCODONE-ACETAMINOPHEN 5-325 MG PO TABS
1.0000 | ORAL_TABLET | ORAL | Status: DC | PRN
  Administered 2012-04-06: 1 via ORAL
  Administered 2012-04-07: 2 via ORAL
  Administered 2012-04-07: 1 via ORAL
  Filled 2012-04-06 (×2): qty 1
  Filled 2012-04-06: qty 2

## 2012-04-06 MED ORDER — SODIUM CHLORIDE 0.9 % IR SOLN
Status: DC | PRN
  Administered 2012-04-06 (×2): 1

## 2012-04-06 MED ORDER — HYDROMORPHONE HCL PF 1 MG/ML IJ SOLN
0.2500 mg | INTRAMUSCULAR | Status: DC | PRN
  Administered 2012-04-06: 0.5 mg via INTRAVENOUS

## 2012-04-06 MED ORDER — ONDANSETRON HCL 4 MG/2ML IJ SOLN
INTRAMUSCULAR | Status: DC | PRN
  Administered 2012-04-06: 4 mg via INTRAVENOUS

## 2012-04-06 MED ORDER — OXYCODONE-ACETAMINOPHEN 5-325 MG PO TABS: 1.0000 | ORAL_TABLET | ORAL | Status: AC | PRN

## 2012-04-06 MED ORDER — DROPERIDOL 2.5 MG/ML IJ SOLN
INTRAMUSCULAR | Status: AC
  Administered 2012-04-06: 0.625 mg via INTRAVENOUS
  Filled 2012-04-06: qty 2

## 2012-04-06 MED ORDER — SODIUM CHLORIDE 0.9 % IJ SOLN: 3.0000 mL | INTRAMUSCULAR | Status: DC | PRN

## 2012-04-06 MED ORDER — NEOSTIGMINE METHYLSULFATE 1 MG/ML IJ SOLN
INTRAMUSCULAR | Status: DC | PRN
  Administered 2012-04-06: 4 mg via INTRAVENOUS

## 2012-04-06 MED ORDER — OXYCODONE HCL 5 MG PO TABS: 5.0000 mg | ORAL_TABLET | ORAL | Status: DC | PRN

## 2012-04-06 MED ORDER — FENTANYL CITRATE 0.05 MG/ML IJ SOLN
INTRAMUSCULAR | Status: DC | PRN
  Administered 2012-04-06: 50 ug via INTRAVENOUS
  Administered 2012-04-06: 25 ug via INTRAVENOUS
  Administered 2012-04-06: 150 ug via INTRAVENOUS
  Administered 2012-04-06 (×2): 50 ug via INTRAVENOUS

## 2012-04-06 MED ORDER — SODIUM CHLORIDE 0.9 % IJ SOLN: 3.0000 mL | Freq: Two times a day (BID) | INTRAMUSCULAR | Status: DC

## 2012-04-06 MED ORDER — GLYCOPYRROLATE 0.2 MG/ML IJ SOLN
INTRAMUSCULAR | Status: DC | PRN
  Administered 2012-04-06: 0.6 mg via INTRAVENOUS

## 2012-04-06 MED ORDER — PROPOFOL 10 MG/ML IV EMUL
INTRAVENOUS | Status: DC | PRN
  Administered 2012-04-06: 200 mg via INTRAVENOUS

## 2012-04-06 MED ORDER — ONDANSETRON HCL 4 MG/2ML IJ SOLN: 4.0000 mg | Freq: Four times a day (QID) | INTRAMUSCULAR | Status: DC | PRN

## 2012-04-06 MED ORDER — ENOXAPARIN SODIUM 40 MG/0.4ML ~~LOC~~ SOLN
40.0000 mg | SUBCUTANEOUS | Status: DC
  Administered 2012-04-07: 40 mg via SUBCUTANEOUS
  Filled 2012-04-06 (×2): qty 0.4

## 2012-04-06 MED ORDER — DROPERIDOL 2.5 MG/ML IJ SOLN
0.6250 mg | INTRAMUSCULAR | Status: DC | PRN
  Administered 2012-04-06: 0.625 mg via INTRAVENOUS

## 2012-04-06 MED ORDER — SODIUM CHLORIDE 0.9 % IV SOLN
INTRAVENOUS | Status: DC | PRN
  Administered 2012-04-06: 10:00:00

## 2012-04-06 MED ORDER — 0.9 % SODIUM CHLORIDE (POUR BTL) OPTIME
TOPICAL | Status: DC | PRN
  Administered 2012-04-06: 1000 mL

## 2012-04-06 SURGICAL SUPPLY — 53 items
APL SKNCLS STERI-STRIP NONHPOA (GAUZE/BANDAGES/DRESSINGS) ×1
APPLIER CLIP 5 13 M/L LIGAMAX5 (MISCELLANEOUS) ×2
APR CLP MED LRG 5 ANG JAW (MISCELLANEOUS) ×1
BAG SPEC RTRVL LRG 6X4 10 (ENDOMECHANICALS) ×1
BANDAGE ADHESIVE 1X3 (GAUZE/BANDAGES/DRESSINGS) ×6 IMPLANT
BENZOIN TINCTURE PRP APPL 2/3 (GAUZE/BANDAGES/DRESSINGS) ×2 IMPLANT
BLADE SURG ROTATE 9660 (MISCELLANEOUS) IMPLANT
CANISTER SUCTION 2500CC (MISCELLANEOUS) ×2 IMPLANT
CHLORAPREP W/TINT 26ML (MISCELLANEOUS) ×2 IMPLANT
CLIP APPLIE 5 13 M/L LIGAMAX5 (MISCELLANEOUS) ×1 IMPLANT
CLOTH BEACON ORANGE TIMEOUT ST (SAFETY) ×2 IMPLANT
COVER MAYO STAND STRL (DRAPES) IMPLANT
COVER SURGICAL LIGHT HANDLE (MISCELLANEOUS) ×2 IMPLANT
DECANTER SPIKE VIAL GLASS SM (MISCELLANEOUS) ×2 IMPLANT
DRAPE C-ARM 42X72 X-RAY (DRAPES) IMPLANT
DRAPE UTILITY 15X26 W/TAPE STR (DRAPE) ×4 IMPLANT
DRSG TEGADERM 4X4.75 (GAUZE/BANDAGES/DRESSINGS) ×2 IMPLANT
ELECT REM PT RETURN 9FT ADLT (ELECTROSURGICAL) ×2
ELECTRODE REM PT RTRN 9FT ADLT (ELECTROSURGICAL) ×1 IMPLANT
GAUZE SPONGE 2X2 8PLY STRL LF (GAUZE/BANDAGES/DRESSINGS) ×1 IMPLANT
GLOVE BIO SURGEON STRL SZ7.5 (GLOVE) ×1 IMPLANT
GLOVE BIOGEL M STRL SZ7.5 (GLOVE) ×2 IMPLANT
GLOVE BIOGEL PI IND STRL 7.0 (GLOVE) IMPLANT
GLOVE BIOGEL PI IND STRL 7.5 (GLOVE) IMPLANT
GLOVE BIOGEL PI IND STRL 8 (GLOVE) ×1 IMPLANT
GLOVE BIOGEL PI INDICATOR 7.0 (GLOVE) ×3
GLOVE BIOGEL PI INDICATOR 7.5 (GLOVE) ×1
GLOVE BIOGEL PI INDICATOR 8 (GLOVE) ×1
GLOVE ECLIPSE 7.0 STRL STRAW (GLOVE) ×1 IMPLANT
GLOVE SURG SS PI 7.0 STRL IVOR (GLOVE) ×1 IMPLANT
GOWN STRL NON-REIN LRG LVL3 (GOWN DISPOSABLE) ×6 IMPLANT
GOWN STRL REIN XL XLG (GOWN DISPOSABLE) ×2 IMPLANT
KIT BASIN OR (CUSTOM PROCEDURE TRAY) ×2 IMPLANT
KIT ROOM TURNOVER OR (KITS) ×2 IMPLANT
NS IRRIG 1000ML POUR BTL (IV SOLUTION) ×2 IMPLANT
PAD ARMBOARD 7.5X6 YLW CONV (MISCELLANEOUS) ×4 IMPLANT
POUCH SPECIMEN RETRIEVAL 10MM (ENDOMECHANICALS) ×2 IMPLANT
SCISSORS LAP 5X35 DISP (ENDOMECHANICALS) ×1 IMPLANT
SET CHOLANGIOGRAPH 5 50 .035 (SET/KITS/TRAYS/PACK) IMPLANT
SET IRRIG TUBING LAPAROSCOPIC (IRRIGATION / IRRIGATOR) ×2 IMPLANT
SLEEVE ENDOPATH XCEL 5M (ENDOMECHANICALS) ×4 IMPLANT
SPECIMEN JAR SMALL (MISCELLANEOUS) ×2 IMPLANT
SPONGE GAUZE 2X2 STER 10/PKG (GAUZE/BANDAGES/DRESSINGS) ×1
STRIP CLOSURE SKIN 1/2X4 (GAUZE/BANDAGES/DRESSINGS) ×1 IMPLANT
SUT MNCRL AB 4-0 PS2 18 (SUTURE) ×2 IMPLANT
SUT VICRYL 0 UR6 27IN ABS (SUTURE) IMPLANT
TOWEL OR 17X24 6PK STRL BLUE (TOWEL DISPOSABLE) ×2 IMPLANT
TOWEL OR 17X26 10 PK STRL BLUE (TOWEL DISPOSABLE) ×2 IMPLANT
TRAY LAPAROSCOPIC (CUSTOM PROCEDURE TRAY) ×2 IMPLANT
TROCAR XCEL 12X100 BLDLESS (ENDOMECHANICALS) ×1 IMPLANT
TROCAR XCEL BLUNT TIP 100MML (ENDOMECHANICALS) ×2 IMPLANT
TROCAR XCEL NON-BLD 5MMX100MML (ENDOMECHANICALS) ×2 IMPLANT
WATER STERILE IRR 1000ML POUR (IV SOLUTION) IMPLANT

## 2012-04-06 NOTE — Discharge Instructions (Signed)
[facility] LAPAROSCOPIC SURGERY: POST OP INSTRUCTIONS Always review your discharge instruction sheet given to you by the facility where your surgery was performed. IF YOU HAVE DISABILITY OR FAMILY LEAVE FORMS, YOU MUST BRING THEM TO THE OFFICE FOR PROCESSING.   DO NOT GIVE THEM TO YOUR DOCTOR.  1. A prescription for pain medication may be given to you upon discharge.  Take your pain medication as prescribed, if needed.  If narcotic pain medicine is not needed, then you may take acetaminophen (Tylenol) or ibuprofen (Advil) as needed. 2. Take your usually prescribed medications unless otherwise directed. 3. If you need a refill on your pain medication, please contact your pharmacy.  They will contact our office to request authorization. Prescriptions will not be filled after 5pm or on week-ends. 4. You should follow a light diet the first few days after arrival home, such as soup and crackers, etc.  Be sure to include lots of fluids daily. 5. Most patients will experience some swelling and bruising in the area of the incisions.  Ice packs will help.  Swelling and bruising can take several days to resolve.  6. It is common to experience some constipation if taking pain medication after surgery.  Increasing fluid intake and taking a stool softener (such as Colace) will usually help or prevent this problem from occurring.  A mild laxative (Milk of Magnesia or Miralax) should be taken according to package instructions if there are no bowel movements after 48 hours. 7. Unless discharge instructions indicate otherwise, you may remove your bandages 48 hours after surgery, and you may shower at that time.  You  have steri-strips (small skin tapes) in place directly over the incision.  These strips should be left on the skin for 7-10 days.   8. ACTIVITIES:  You may resume regular (light) daily activities beginning the next day--such as daily self-care, walking, climbing stairs--gradually  increasing activities as tolerated.  You may have sexual intercourse when it is comfortable.  Refrain from any heavy lifting or straining until approved by your doctor. a. You may drive when you are no longer taking prescription pain medication, you can comfortably wear a seatbelt, and you can safely maneuver your car and apply brakes. 9. You should see your doctor in the office for a follow-up appointment approximately 2-3 weeks after your surgery.  Make sure that you call for this appointment within a day or two after you arrive home to insure a convenient appointment time. 10. OTHER INSTRUCTIONS:none  WHEN TO CALL YOUR DOCTOR: 1. Fever over 101.0 2. Inability to urinate 3. Continued bleeding from incision. 4. Increased pain, redness, or drainage from the incision. 5. Increasing abdominal pain  The clinic staff is available to answer your questions during regular business hours.  Please don't hesitate to call and ask to speak to one of the nurses for clinical concerns.  If you have a medical emergency, go to the nearest emergency room or call 911.  A surgeon from [facility] is always on call at the hospital. [address], [address], [place], [place]  [address] ? [address] [address], [place], [place]   [address] [phone] ? [phone] ? FAX [phone] Web site: [email or web address]

## 2012-04-06 NOTE — Anesthesia Postprocedure Evaluation (Signed)
Anesthesia Post Note  Patient: Danielle Cohen  Procedure(s) Performed: Procedure(s) (LRB): LAPAROSCOPIC CHOLECYSTECTOMY WITH INTRAOPERATIVE CHOLANGIOGRAM (N/A)  Anesthesia type: General  Patient location: PACU  Post pain: Pain level controlled and Adequate analgesia  Post assessment: Post-op Vital signs reviewed, Patient's Cardiovascular Status Stable, Respiratory Function Stable, Patent Airway and Pain level controlled  Last Vitals:  Filed Vitals:   04/06/12 1145  BP:   Pulse: 69  Temp:   Resp: 17    Post vital signs: Reviewed and stable  Level of consciousness: awake, alert  and oriented  Complications: No apparent anesthesia complications

## 2012-04-06 NOTE — Transfer of Care (Signed)
Immediate Anesthesia Transfer of Care Note  Patient: Danielle Cohen  Procedure(s) Performed: Procedure(s) (LRB): LAPAROSCOPIC CHOLECYSTECTOMY WITH INTRAOPERATIVE CHOLANGIOGRAM (N/A)  Patient Location: PACU  Anesthesia Type: General  Level of Consciousness: awake, alert  and oriented  Airway & Oxygen Therapy: Patient Spontanous Breathing and Patient connected to nasal cannula oxygen  Post-op Assessment: Report given to PACU RN, Post -op Vital signs reviewed and stable and Patient moving all extremities X 4  Post vital signs: Reviewed and stable  Complications: No apparent anesthesia complications

## 2012-04-06 NOTE — Anesthesia Preprocedure Evaluation (Addendum)
Anesthesia Evaluation  Patient identified by MRN, date of birth, ID band Patient awake    Reviewed: Allergy & Precautions, H&P , NPO status , Patient's Chart, lab work & pertinent test results  History of Anesthesia Complications Negative for: history of anesthetic complications  Airway Mallampati: II TM Distance: >3 FB Neck ROM: Full    Dental  (+) Teeth Intact and Dental Advisory Given   Pulmonary neg pulmonary ROS,  breath sounds clear to auscultation  Pulmonary exam normal       Cardiovascular negative cardio ROS  Rhythm:Regular     Neuro/Psych negative neurological ROS     GI/Hepatic Neg liver ROS,   Endo/Other  negative endocrine ROSMorbid obesity  Renal/GU negative Renal ROS     Musculoskeletal   Abdominal   Peds negative pediatric ROS (+)  Hematology   Anesthesia Other Findings   Reproductive/Obstetrics                          Anesthesia Physical Anesthesia Plan  ASA: II  Anesthesia Plan: General   Post-op Pain Management:    Induction: Intravenous  Airway Management Planned: Oral ETT  Additional Equipment:   Intra-op Plan:   Post-operative Plan:   Informed Consent: I have reviewed the patients History and Physical, chart, labs and discussed the procedure including the risks, benefits and alternatives for the proposed anesthesia with the patient or authorized representative who has indicated his/her understanding and acceptance.     Plan Discussed with: CRNA, Anesthesiologist and Surgeon  Anesthesia Plan Comments:         Anesthesia Quick Evaluation

## 2012-04-06 NOTE — H&P (Signed)
  CC: here for surgery  Danielle Cohen is a 36 y.o. female.  HPI  36 year old female referred by Dr. Juleen China for evaluation of abdominal pain. The patient has had intermittent abdominal pain for about a month. It occurs intermittently. She states it is mainly occurs at night. It was gently last around 45 minutes to an hour. It is not associated with any nausea or vomiting. It is mainly in her epigastric and left upper quadrant. She describes it as a deep pain. She rates it as an 8-9/10. She denies any jaundice, acholic stools, NSAID use, weight loss, melena or hematochezia. She denies any diarrhea or constipation. She cannot attribute it to any particular foods. She went to the ER for evaluation where an abdominal ultrasound was performed which demonstrated multiple gallstones. She gets relief with the discomfort by taking the Percocet. She denies any reflux  Since her last visit, she developed a sore throat & went to the ED. Her throat is no longer sore, and she denies fevers, chills.   Past Medical History  Diagnosis Date  . Thyroid disease     told had a thyroid cyst 11/11- needs recheck in 6 months, not done  . Gallstones    Past Surgical History  Procedure Date  . Appendectomy     in [place]  . Vaginal births     6 births- 1 set of twins 34yrs- 6months   No Known Allergies  No current facility-administered medications on file prior to encounter.   No current outpatient prescriptions on file prior to encounter.   Family History  Problem Relation Age of Onset  . Anesthesia problems Neg Hx    History   Social History  . Marital Status: Married    Spouse Name: N/A    Number of Children: N/A  . Years of Education: N/A   Social History Main Topics  . Smoking status: Never Smoker   . Smokeless tobacco: Never Used  . Alcohol Use: No  . Drug Use: No  . Sexually Active: None   Other Topics Concern  . None   Social History Narrative  . None   Physical Exam: BP 94/62   Pulse 91  Temp(Src) 97.8 F (36.6 C) (Oral)  Resp 20  SpO2 100%  LMP 03/14/2012  Breastfeeding? Yes  Alert, nad Reg Non-labored. No wheezing Soft, nt, nd No edema  A/P:  Symptomatic cholelithiasis  To OR for Lap Chole with IOC  The risks and benefits of the procedure have been discussed at length with the patient.  The patient understands the proposed procedure, potential alternative treatments, and the course of recovery to be expected.  All of the patient's questions have been answered at this time.  The patient wishes to proceed with surgery.  Mary Sella. Andrey Campanile, MD, FACS General, Bariatric, & Minimally Invasive Surgery [facility], [place]

## 2012-04-06 NOTE — Op Note (Signed)
Laparoscopic Cholecystectomy with IOC Procedure Note  Indications: This patient presents with symptomatic gallbladder disease and will undergo laparoscopic cholecystectomy.  Pre-operative Diagnosis: Calculus of gallbladder with other cholecystitis, without mention of obstruction  Post-operative Diagnosis: Same  Surgeon: Atilano Ina   Assistants: Koren Bound, PA-student  Anesthesia: General endotracheal anesthesia and Local anesthesia 0.25.% bupivacaine, with epinephrine  ASA Class: 2  Procedure Details  The patient was seen again in the Holding Room. The risks, benefits, complications, treatment options, and expected outcomes were discussed with the patient. The possibilities of reaction to medication, pulmonary aspiration, perforation of viscus, bleeding, recurrent infection, finding a normal gallbladder, the need for additional procedures, failure to diagnose a condition, the possible need to convert to an open procedure, and creating a complication requiring transfusion or operation were discussed with the patient. The likelihood of improving the patient's symptoms with return to their baseline status is good.  The patient and/or family concurred with the proposed plan, giving informed consent. The site of surgery properly noted. The patient was taken to Operating Room, identified as Danielle Cohen and the procedure verified as Laparoscopic Cholecystectomy with Intraoperative Cholangiogram. A Time Out was held and the above information confirmed.  Prior to the induction of general anesthesia, antibiotic prophylaxis was administered. General endotracheal anesthesia was then administered and tolerated well. After the induction, the abdomen was prepped with Chloraprep and draped in the sterile fashion. The patient was positioned in the supine position.  Local anesthetic agent was injected into the skin near the umbilicus and an incision made. We dissected down to the abdominal fascia  with blunt dissection.  The fascia was incised vertically and we entered the peritoneal cavity bluntly.  A pursestring suture of 0-Vicryl was placed around the fascial opening.  The Hasson cannula was inserted and secured with the stay suture.  Pneumoperitoneum was then created with CO2 and tolerated well without any adverse changes in the patient's vital signs. An 5-mm port was placed in the subxiphoid position.  Two 5-mm ports were placed in the right upper quadrant. All skin incisions were infiltrated with a local anesthetic agent before making the incision and placing the trocars.   We positioned the patient in reverse Trendelenburg, tilted slightly to the patient's left.  The gallbladder was identified, the fundus grasped and retracted cephalad. Adhesions were lysed bluntly and with the electrocautery where indicated, taking care not to injure any adjacent organs or viscus. The infundibulum was grasped and retracted laterally, exposing the peritoneum overlying the triangle of Calot. This was then divided and exposed in a blunt fashion. A critical view of the cystic duct and cystic artery was obtained.  The cystic duct was clearly identified and bluntly dissected circumferentially. The cystic duct was ligated with a clip distally.   An incision was made in the cystic duct and the [facility] cholangiogram catheter introduced. The catheter was secured using a clip. A cholangiogram was then obtained which showed good visualization of the distal and proximal biliary tree with no sign of filling defects or obstruction.  Contrast flowed easily into the duodenum. The catheter was then removed.   The cystic duct was then ligated with clips and divided. The cystic artery was identified, dissected free, ligated with clips and divided as well.   The gallbladder was dissected from the liver bed in retrograde fashion with the electrocautery. As we reached the dome of the gallbladder some bile and gallstones fell out the  gallbladder. I was able to retrieve some of these  with a grasper; however, some were too large to come thru a 5 mm trocar. I exchange the 5mm subxiphoid trocar for a 12 mm trocar under direct supervision. A stone grasper was used to retrieve all of the remaining spilled stones.  The gallbladder was removed and placed in an Endocatch sac.  The gallbladder and Endocatch sac were then removed through the umbilical port site. The liver bed was irrigated with 2 L of saline and inspected. Hemostasis was achieved with the electrocautery. Copious irrigation was utilized and was repeatedly aspirated until clear. There was no evidence of retained gallstones.   The pursestring suture was used to close the umbilical fascia.    We again inspected the right upper quadrant for hemostasis.  The umbilical closure was inspected and there was a small air leak and nothing trapped within the closure. I placed an additional figure of eight 0-vicryl suture. The umbilical fascial closure was now air tight. Pneumoperitoneum was released as we removed the trocars.  4-0 Monocryl was used to close the skin.   Benzoin, steri-strips, and clean dressings were applied. The patient was then extubated and brought to the recovery room in stable condition. Instrument, sponge, and needle counts were correct at closure and at the conclusion of the case.   Findings: Chronic Cholecystitis with Cholelithiasis  Estimated Blood Loss: Minimal         Drains: none         Specimens: Gallbladder           Complications: None; patient tolerated the procedure well.         Disposition: PACU - hemodynamically stable.         Condition: stable  Danielle Cohen. Andrey Campanile, MD, FACS General, Bariatric, & Minimally Invasive Surgery [facility], [place]

## 2012-04-06 NOTE — Progress Notes (Signed)
Received from PACU post lap cholecystectomy, incision CDI, VSS, not in any distress.

## 2012-04-07 ENCOUNTER — Encounter (HOSPITAL_COMMUNITY): Payer: Self-pay | Admitting: General Surgery

## 2012-04-07 MED ORDER — OXYCODONE-ACETAMINOPHEN 5-325 MG PO TABS: 1.0000 | ORAL_TABLET | ORAL | Status: DC | PRN

## 2012-04-07 NOTE — Discharge Summary (Signed)
Physician Discharge Summary  Patient ID: STACYANN MCCONAUGHY MRN: [id number] DOB/AGE: Jul 31, 1976 36 y.o.  Admit date: 04/06/2012 Discharge date: 04/07/2012  Admission Diagnoses: Chronic cholecystitis with calculous Discharge Diagnoses:  same  Discharged Condition: good  Hospital Course: The patient was taken to the OR for an elective LAPAROSCOPIC CHOLECYSTECTOMY WITH CHOLANGIOGRAM. She was kept overnight for observation and pain control. On POD 1 she was deemed stable for discharge. She was tolerating a regular diet. Her vitals were stable. She was ambulating without assistance. Her pain was controlled with pain pills.  Consults: None  Significant Diagnostic Studies:  none  Treatments: IV hydration, analgesia: Morphine and percocet and surgery: see above  Discharge Exam: Blood pressure 92/58, pulse 101, temperature 98.6 F (37 C), temperature source Oral, resp. rate 16, height 5\' 3"  (1.6 m), weight 198 lb 8 oz (90.039 kg), last menstrual period 03/14/2012, SpO2 100.00%, currently breastfeeding. Alert, nad cta Reg Obese, soft, incision c/d/i. Expected mild tenderness.   Disposition: 01-Home or Self Care  Discharge Orders    Future Appointments: Provider: Department: Dept Phone: Center:   04/10/2012 1:30 PM August Luz, CNM [facility] [phone] [facility]   04/26/2012 10:15 AM Atilano Ina, MD,FACS [facility] [phone] None     Future Orders Please Complete By Expires   Diet - low sodium heart healthy      Increase activity slowly        Medication List  As of 04/07/2012  2:32 PM   TAKE these medications         cetirizine-pseudoephedrine 5-120 MG per tablet   Commonly known as: ZYRTEC-D   Take 1 tablet by mouth daily.      magic mouthwash w/lidocaine Soln   Take 5 mLs by mouth 3 (three) times daily as needed.      oxyCODONE-acetaminophen 5-325 MG per tablet   Commonly known as: PERCOCET   Take 1-2 tablets by mouth every 4 (four) hours as needed for pain.             Follow-up Information    Follow up with Atilano Ina, MD,FACS. Schedule an appointment as soon as possible for a visit in 3 weeks.   Contact information:   [employer] [address], [address] [place] [address] [phone]         Mary Sella. Andrey Campanile, MD, FACS General, Bariatric, & Minimally Invasive Surgery [facility], [place] Signed: Atilano Ina 04/07/2012, 2:32 PM

## 2012-04-10 ENCOUNTER — Ambulatory Visit: Payer: Self-pay | Admitting: Physician Assistant

## 2012-04-26 ENCOUNTER — Encounter (INDEPENDENT_AMBULATORY_CARE_PROVIDER_SITE_OTHER): Payer: Self-pay | Admitting: General Surgery

## 2012-04-26 ENCOUNTER — Ambulatory Visit (INDEPENDENT_AMBULATORY_CARE_PROVIDER_SITE_OTHER): Payer: PRIVATE HEALTH INSURANCE | Admitting: General Surgery

## 2012-04-26 VITALS — BP 100/62 | HR 60 | Temp 98.3°F | Resp 16 | Ht 64.0 in | Wt 197.0 lb

## 2012-04-26 DIAGNOSIS — Z09 Encounter for follow-up examination after completed treatment for conditions other than malignant neoplasm: Secondary | ICD-10-CM

## 2012-04-26 NOTE — Progress Notes (Signed)
Subjective:     Patient ID: Danielle Cohen, female   DOB: [birth year], 36 y.o.   MRN: [id number]  HPI 36 year old female comes in today for her postoperative appointment after undergoing laparoscopic cholecystectomy with cholangiogram on June 6. She states that she did very well after surgery. She denies any fevers or chills. She denies any nausea or vomiting. He denies any diarrhea or constipation. He denies any abdominal pain. She states that she feels great. She has no questions or concerns today.  Review of Systems     Objective:   Physical Exam BP 100/62  Pulse 60  Temp 98.3 F (36.8 C) (Temporal)  Resp 16  Ht 5\' 4"  (1.626 m)  Wt 197 lb (89.359 kg)  BMI 33.82 kg/m2  Gen: alert, NAD, non-toxic appearing Pupils: equal, no scleral icterus Pulm: Lungs clear to auscultation, symmetric chest rise CV: regular rate and rhythm Abd: soft, nontender, nondistended. Well-healed trocar sites. No cellulitis. No incisional hernia Ext: no edema, no calf tenderness Skin: no rash, no jaundice     Assessment:     S/p lap chole with ioc for chronic cholecystitis and cholelithiasis    Plan:     We discussed her pathology report. She appears to be doing well. Released to full activities. F/u PRN>  Mary Sella. Andrey Campanile, MD, FACS General, Bariatric, & Minimally Invasive Surgery [facility], [place]

## 2012-07-07 ENCOUNTER — Telehealth (INDEPENDENT_AMBULATORY_CARE_PROVIDER_SITE_OTHER): Payer: Self-pay | Admitting: General Surgery

## 2012-07-07 NOTE — Telephone Encounter (Signed)
Pt calling to report new pain (for last two weeks) at site of GB.  Had lap chole on 04/06/12.  She denies nausea, vomiting and is have regular bowel movements daily.  She describes  "like a small ball inside" that is painful and she must hold it to be able to walk.  Wants appt with Dr. Andrey Campanile to check it, please.

## 2012-07-07 NOTE — Telephone Encounter (Signed)
Dr Andrey Campanile- Do you want to see patient. She is three months out- should this deter to her PCP?

## 2013-08-26 ENCOUNTER — Emergency Department (HOSPITAL_COMMUNITY)
Admission: EM | Admit: 2013-08-26 | Discharge: 2013-08-26 | Disposition: A | Discharge status: Home / Self Care | Payer: No Typology Code available for payment source | Attending: Emergency Medicine | Admitting: Emergency Medicine

## 2013-08-26 ENCOUNTER — Encounter (HOSPITAL_COMMUNITY): Payer: Self-pay | Admitting: Emergency Medicine

## 2013-08-26 DIAGNOSIS — R1031 Right lower quadrant pain: Secondary | ICD-10-CM

## 2013-08-26 DIAGNOSIS — R109 Unspecified abdominal pain: Secondary | ICD-10-CM | POA: Insufficient documentation

## 2013-08-26 DIAGNOSIS — Z79899 Other long term (current) drug therapy: Secondary | ICD-10-CM | POA: Insufficient documentation

## 2013-08-26 DIAGNOSIS — Z9089 Acquired absence of other organs: Secondary | ICD-10-CM | POA: Insufficient documentation

## 2013-08-26 DIAGNOSIS — Z8639 Personal history of other endocrine, nutritional and metabolic disease: Secondary | ICD-10-CM | POA: Insufficient documentation

## 2013-08-26 DIAGNOSIS — Z862 Personal history of diseases of the blood and blood-forming organs and certain disorders involving the immune mechanism: Secondary | ICD-10-CM | POA: Insufficient documentation

## 2013-08-26 DIAGNOSIS — Z9889 Other specified postprocedural states: Secondary | ICD-10-CM | POA: Insufficient documentation

## 2013-08-26 DIAGNOSIS — Z8719 Personal history of other diseases of the digestive system: Secondary | ICD-10-CM | POA: Insufficient documentation

## 2013-08-26 MED ORDER — TRAMADOL HCL 50 MG PO TABS: 50.0000 mg | ORAL_TABLET | Freq: Four times a day (QID) | ORAL | Status: DC | PRN

## 2013-08-26 MED ORDER — TRAMADOL HCL 50 MG PO TABS
50.0000 mg | ORAL_TABLET | Freq: Once | ORAL | Status: AC
  Administered 2013-08-26: 50 mg via ORAL
  Filled 2013-08-26: qty 1

## 2013-08-26 NOTE — ED Provider Notes (Signed)
CSN: [id number]     Arrival date & time 08/26/13  1239 History  This chart was scribed for non-physician practitioner, Wynetta Emery, PA-C, working with Doug Sou, MD, by Karle Plumber, ED Scribe.  This patient was seen in room TR06C/TR06C and the patient's care was started at 1:53 PM.  Chief Complaint  Patient presents with  . Groin Pain  . Leg Pain   The history is provided by the patient. No language interpreter was used.   HPI Comments:  Danielle Cohen is a 37 y.o. female who presents to the Emergency Department complaining of left-sided groin pain that radiates down her left entire leg x2 monthsShe states the pain started worsening last night. She reports taking Ibuprofen for the pain with moderate relief but reports taking 600 mg last night with no relief. Pt denies fever, nausea, vomiting, bowel or bladder changes. She reports surgical h/o cholecystectomy and appendectomy.    Past Medical History  Diagnosis Date  . Thyroid disease     told had a thyroid cyst 11/11- needs recheck in 6 months, not done  . Gallstones   . Cholecystitis 04/2012   Past Surgical History  Procedure Laterality Date  . Appendectomy      in [place]  . Vaginal births      6 births- 1 set of twins 64yrs- 6months  . Cholecystectomy  04/06/2012    Procedure: LAPAROSCOPIC CHOLECYSTECTOMY WITH INTRAOPERATIVE CHOLANGIOGRAM;  Surgeon: Atilano Ina, MD,FACS;  Location: [facility] OR;  Service: General;  Laterality: N/A;   Family History  Problem Relation Age of Onset  . Anesthesia problems Neg Hx    History  Substance Use Topics  . Smoking status: Never Smoker   . Smokeless tobacco: Never Used  . Alcohol Use: No   OB History   Grav Para Term Preterm Abortions TAB SAB Ect Mult Living   5 5 5  0 0 0 0 0 1 6     Review of Systems A complete 10 system review of systems was obtained and all systems are negative except as noted in the HPI and PMH.   Allergies  Review of patient's allergies indicates no  known allergies.  Home Medications   Current Outpatient Rx  Name  Route  Sig  Dispense  Refill  . Multiple Vitamin (MULTIVITAMIN) tablet   Oral   Take 1 tablet by mouth daily.          Triage Vitals: BP 98/62  Pulse 92  Temp(Src) 98.1 F (36.7 C) (Oral)  Resp 18  SpO2 99% Physical Exam  Nursing note and vitals reviewed. Constitutional: She is oriented to person, place, and time. She appears well-developed and well-nourished. No distress.  HENT:  Head: Normocephalic.  Mouth/Throat: Oropharynx is clear and moist.  Eyes: Conjunctivae and EOM are normal. Pupils are equal, round, and reactive to light.  Neck: Normal range of motion.  Cardiovascular: Normal rate and regular rhythm.   Pulmonary/Chest: Effort normal and breath sounds normal. No stridor. No respiratory distress. She has no wheezes. She has no rales. She exhibits no tenderness.  Abdominal: Soft. Bowel sounds are normal. She exhibits no distension and no mass. There is no tenderness. There is no rebound and no guarding.  Musculoskeletal: Normal range of motion.  Neurological: She is alert and oriented to person, place, and time.  Range of motion to right hip, neurovascularly intact  Psychiatric: She has a normal mood and affect.    ED Course  Procedures (including critical care time) DIAGNOSTIC  STUDIES: Oxygen Saturation is 99% on RA, normal by my interpretation.   COORDINATION OF CARE: 1:56 PM- Will prescribe pt medication for pain and advised pt to return with any new or worsening symptoms. Will give pain medication in ED and pt stated her brother will drive her home. Pt verbalizes understanding and agrees to plan.  Medications - No data to display  Labs Review Labs Reviewed - No data to display Imaging Review No results found.  EKG Interpretation   None       MDM  No diagnosis found.  Filed Vitals:   08/26/13 1241  BP: 98/62  Pulse: 92  Temp: 98.1 F (36.7 C)  TempSrc: Oral  Resp: 18  SpO2:  99%     Danielle Cohen is a 37 y.o. female right groin pain, radiating down the right leg, abdominal exam is nontender, patient has full range of motion, neurovascularly intact. Reassured patient, after follow with primary care, return precautions were discussed.  Medications  traMADol (ULTRAM) tablet 50 mg (50 mg Oral Given 08/26/13 1408)    Pt is hemodynamically stable, appropriate for, and amenable to discharge at this time. Pt verbalized understanding and agrees with care plan. All questions answered. Outpatient follow-up and specific return precautions discussed.    Discharge Medication List as of 08/26/2013  2:04 PM    START taking these medications   Details  traMADol (ULTRAM) 50 MG tablet Take 1 tablet (50 mg total) by mouth every 6 (six) hours as needed for pain., Starting 08/26/2013, Until Discontinued, Print        I personally performed the services described in this documentation, which was scribed in my presence. The recorded information has been reviewed and is accurate.  Note: Portions of this report may have been transcribed using voice recognition software. Every effort was made to ensure accuracy; however, inadvertent computerized transcription errors may be present   .   Joni Reining Clanton Emanuelson, PA-C 08/29/13 0730

## 2013-08-26 NOTE — ED Notes (Signed)
Per pt sts for 2 months she has had right groin pain that radiates down her right leg. sts she takes ibuprofen and usually helps but not last night. sts hurts when she bends over and walks.

## 2013-08-30 NOTE — ED Provider Notes (Signed)
Medical screening examination/treatment/procedure(s) were performed by non-physician practitioner and as supervising physician I was immediately available for consultation/collaboration.  EKG Interpretation   None        Doug Sou, MD 08/30/13 [phone]

## 2014-01-19 ENCOUNTER — Emergency Department (HOSPITAL_COMMUNITY)
Admission: EM | Admit: 2014-01-19 | Discharge: 2014-01-19 | Disposition: A | Discharge status: Home / Self Care | Payer: No Typology Code available for payment source | Attending: Emergency Medicine | Admitting: Emergency Medicine

## 2014-01-19 ENCOUNTER — Encounter (HOSPITAL_COMMUNITY): Payer: Self-pay | Admitting: Emergency Medicine

## 2014-01-19 ENCOUNTER — Emergency Department (HOSPITAL_COMMUNITY): Payer: No Typology Code available for payment source

## 2014-01-19 DIAGNOSIS — Z862 Personal history of diseases of the blood and blood-forming organs and certain disorders involving the immune mechanism: Secondary | ICD-10-CM | POA: Insufficient documentation

## 2014-01-19 DIAGNOSIS — R69 Illness, unspecified: Secondary | ICD-10-CM

## 2014-01-19 DIAGNOSIS — J111 Influenza due to unidentified influenza virus with other respiratory manifestations: Secondary | ICD-10-CM | POA: Insufficient documentation

## 2014-01-19 DIAGNOSIS — Z8719 Personal history of other diseases of the digestive system: Secondary | ICD-10-CM | POA: Insufficient documentation

## 2014-01-19 DIAGNOSIS — Z8639 Personal history of other endocrine, nutritional and metabolic disease: Secondary | ICD-10-CM | POA: Insufficient documentation

## 2014-01-19 DIAGNOSIS — Z79899 Other long term (current) drug therapy: Secondary | ICD-10-CM | POA: Insufficient documentation

## 2014-01-19 MED ORDER — AEROCHAMBER PLUS FLO-VU MEDIUM MISC
1.0000 | Freq: Once | Status: AC
  Administered 2014-01-19: 1
  Filled 2014-01-19: qty 1

## 2014-01-19 MED ORDER — ALBUTEROL SULFATE HFA 108 (90 BASE) MCG/ACT IN AERS
2.0000 | INHALATION_SPRAY | Freq: Four times a day (QID) | RESPIRATORY_TRACT | Status: DC
  Filled 2014-01-19 (×2): qty 6.7

## 2014-01-19 MED ORDER — PROMETHAZINE-DM 6.25-15 MG/5ML PO SYRP: 5.0000 mL | ORAL_SOLUTION | Freq: Four times a day (QID) | ORAL | Status: DC | PRN

## 2014-01-19 MED ORDER — PREDNISONE 50 MG PO TABS: 50.0000 mg | ORAL_TABLET | Freq: Every day | ORAL | Status: DC

## 2014-01-19 MED ORDER — ALBUTEROL SULFATE (2.5 MG/3ML) 0.083% IN NEBU
5.0000 mg | INHALATION_SOLUTION | Freq: Once | RESPIRATORY_TRACT | Status: AC
  Administered 2014-01-19: 5 mg via RESPIRATORY_TRACT
  Filled 2014-01-19: qty 6

## 2014-01-19 MED ORDER — GUAIFENESIN ER 1200 MG PO TB12: 1.0000 | ORAL_TABLET | Freq: Two times a day (BID) | ORAL | Status: DC

## 2014-01-19 NOTE — ED Provider Notes (Signed)
CSN: [id number]     Arrival date & time 01/19/14  4098 History  This chart was scribed for Danielle Dolly PA-C working with Laray Anger, DO by Ashley Jacobs, ED scribe. This patient was seen in room TR08C/TR08C and the patient's care was started at 9:52 AM.   None    No chief complaint on file.    (Consider location/radiation/quality/duration/timing/severity/associated sxs/prior Treatment) Patient is a 38 y.o. female presenting with cough. The history is provided by the patient and medical records. No language interpreter was used.  Cough Associated symptoms: sore throat    HPI Comments: Danielle Cohen is a 38 y.o. female who presents to the Emergency Department complaining of cough for the past three days after traveling back home from [place].  Denies nausea and vomiting. Pt has rhinorrhea, chest congestion, and sore throat. Nothing seems to make her symptoms better. Pt does not take any regular medications. She does not have any prior medical complications. She does not have any known allergies to medications.  Past Medical History  Diagnosis Date  . Thyroid disease     told had a thyroid cyst 11/11- needs recheck in 6 months, not done  . Gallstones   . Cholecystitis 04/2012   Past Surgical History  Procedure Laterality Date  . Appendectomy      in [place]  . Vaginal births      6 births- 1 set of twins 50yrs- 6months  . Cholecystectomy  04/06/2012    Procedure: LAPAROSCOPIC CHOLECYSTECTOMY WITH INTRAOPERATIVE CHOLANGIOGRAM;  Surgeon: Atilano Ina, MD,FACS;  Location: [facility] OR;  Service: General;  Laterality: N/A;   Family History  Problem Relation Age of Onset  . Anesthesia problems Neg Hx    History  Substance Use Topics  . Smoking status: Never Smoker   . Smokeless tobacco: Never Used  . Alcohol Use: No   OB History   Grav Para Term Preterm Abortions TAB SAB Ect Mult Living   5 5 5  0 0 0 0 0 1 6     Review of Systems  HENT: Positive for congestion and  sore throat.   Respiratory: Positive for cough.   Gastrointestinal: Negative for nausea and vomiting.  All other systems reviewed and are negative.      Allergies  Review of patient's allergies indicates no known allergies.  Home Medications   Current Outpatient Rx  Name  Route  Sig  Dispense  Refill  . CALCIUM PO   Oral   Take 1 tablet by mouth daily.         Marland Kitchen ibuprofen (ADVIL,MOTRIN) 200 MG tablet   Oral   Take 200 mg by mouth every 6 (six) hours as needed for pain.         . Multiple Vitamin (MULTIVITAMIN) tablet   Oral   Take 1 tablet by mouth daily.         . traMADol (ULTRAM) 50 MG tablet   Oral   Take 1 tablet (50 mg total) by mouth every 6 (six) hours as needed for pain.   15 tablet   0    BP 95/60  Pulse 90  Temp(Src) 98.5 F (36.9 C) (Oral)  Resp 17  SpO2 100%  LMP 01/10/2014 Physical Exam  Nursing note and vitals reviewed. Constitutional: She is oriented to person, place, and time. She appears well-developed and well-nourished. No distress.  HENT:  Head: Normocephalic and atraumatic.  Eyes: Pupils are equal, round, and reactive to light.  Neck: Normal  range of motion. Neck supple.  Cardiovascular: Normal rate, regular rhythm and normal heart sounds.  Exam reveals no gallop and no friction rub.   No murmur heard. Pulmonary/Chest: Effort normal and breath sounds normal. No respiratory distress. She has no wheezes.  Musculoskeletal: She exhibits no edema.  Neurological: She is alert and oriented to person, place, and time. Coordination normal.  Skin: Skin is warm and dry. No rash noted. No erythema.    ED Course  Procedures (including critical care time) DIAGNOSTIC STUDIES: Oxygen Saturation is 100, room air, normal by my interpretation.    COORDINATION OF CARE:  9:55 AM Discussed course of care with pt which includes chest x-ray. Pt understands and agrees.  The patient will be treated for influenza like illness. Told to return here as  needed.  I personally performed the services described in this documentation, which was scribed in my presence. The recorded information has been reviewed and is accurate.   Danielle Dollyhristopher W Raygen Linquist, PA-C 01/19/14 1125  Danielle Dollyhristopher W Celestia Duva, PA-C 01/19/14 1125

## 2014-01-19 NOTE — ED Notes (Signed)
Pt. Stated, i've had a cough for 3 days with runny nose.  [place].

## 2014-01-19 NOTE — Discharge Instructions (Signed)
Return here as needed. Follow up with your primary doctor. INcrease your fluid intake. °

## 2014-01-21 NOTE — ED Provider Notes (Signed)
Medical screening examination/treatment/procedure(s) were performed by non-physician practitioner and as supervising physician I was immediately available for consultation/collaboration.   EKG Interpretation None        Shira Bobst M Bejamin Hackbart, DO 01/21/14 2033 

## 2014-05-08 ENCOUNTER — Encounter (HOSPITAL_COMMUNITY): Payer: Self-pay | Admitting: Emergency Medicine

## 2014-05-08 ENCOUNTER — Emergency Department (HOSPITAL_COMMUNITY)
Admission: EM | Admit: 2014-05-08 | Discharge: 2014-05-08 | Disposition: A | Discharge status: Home / Self Care | Payer: No Typology Code available for payment source | Attending: Emergency Medicine | Admitting: Emergency Medicine

## 2014-05-08 DIAGNOSIS — R1030 Lower abdominal pain, unspecified: Secondary | ICD-10-CM

## 2014-05-08 DIAGNOSIS — R197 Diarrhea, unspecified: Secondary | ICD-10-CM | POA: Insufficient documentation

## 2014-05-08 DIAGNOSIS — R109 Unspecified abdominal pain: Secondary | ICD-10-CM | POA: Insufficient documentation

## 2014-05-08 DIAGNOSIS — E079 Disorder of thyroid, unspecified: Secondary | ICD-10-CM | POA: Insufficient documentation

## 2014-05-08 LAB — COMPREHENSIVE METABOLIC PANEL
ALT: 9 U/L (ref 0–35)
AST: 12 U/L (ref 0–37)
Albumin: 3.1 g/dL — ABNORMAL LOW (ref 3.5–5.2)
Alkaline Phosphatase: 46 U/L (ref 39–117)
Anion gap: 13 (ref 5–15)
BUN: 12 mg/dL (ref 6–23)
CALCIUM: 8.6 mg/dL (ref 8.4–10.5)
CHLORIDE: 103 meq/L (ref 96–112)
CO2: 24 meq/L (ref 19–32)
CREATININE: 0.64 mg/dL (ref 0.50–1.10)
GLUCOSE: 107 mg/dL — AB (ref 70–99)
Potassium: 3.6 mEq/L — ABNORMAL LOW (ref 3.7–5.3)
Sodium: 140 mEq/L (ref 137–147)
Total Protein: 6.9 g/dL (ref 6.0–8.3)

## 2014-05-08 LAB — CBC WITH DIFFERENTIAL/PLATELET
BASOS ABS: 0 10*3/uL (ref 0.0–0.1)
BASOS PCT: 1 % (ref 0–1)
EOS ABS: 0.1 10*3/uL (ref 0.0–0.7)
EOS PCT: 1 % (ref 0–5)
HCT: 34.3 % — ABNORMAL LOW (ref 36.0–46.0)
Hemoglobin: 11.5 g/dL — ABNORMAL LOW (ref 12.0–15.0)
LYMPHS ABS: 2.8 10*3/uL (ref 0.7–4.0)
Lymphocytes Relative: 42 % (ref 12–46)
MCH: 31.2 pg (ref 26.0–34.0)
MCHC: 33.5 g/dL (ref 30.0–36.0)
MCV: 93 fL (ref 78.0–100.0)
Monocytes Absolute: 0.6 10*3/uL (ref 0.1–1.0)
Monocytes Relative: 9 % (ref 3–12)
NEUTROS PCT: 47 % (ref 43–77)
Neutro Abs: 3.2 10*3/uL (ref 1.7–7.7)
PLATELETS: 372 10*3/uL (ref 150–400)
RBC: 3.69 MIL/uL — AB (ref 3.87–5.11)
RDW: 12.5 % (ref 11.5–15.5)
WBC: 6.6 10*3/uL (ref 4.0–10.5)

## 2014-05-08 LAB — URINALYSIS, ROUTINE W REFLEX MICROSCOPIC
BILIRUBIN URINE: NEGATIVE
Glucose, UA: NEGATIVE mg/dL
KETONES UR: NEGATIVE mg/dL
NITRITE: NEGATIVE
PH: 6.5 (ref 5.0–8.0)
PROTEIN: NEGATIVE mg/dL
Specific Gravity, Urine: 1.005 (ref 1.005–1.030)
UROBILINOGEN UA: 0.2 mg/dL (ref 0.0–1.0)

## 2014-05-08 LAB — POC URINE PREG, ED: PREG TEST UR: NEGATIVE

## 2014-05-08 LAB — URINE MICROSCOPIC-ADD ON

## 2014-05-08 MED ORDER — DICYCLOMINE HCL 10 MG PO CAPS
20.0000 mg | ORAL_CAPSULE | Freq: Once | ORAL | Status: AC
Start: 2014-05-08 — End: 2014-05-08
  Administered 2014-05-08: 20 mg via ORAL
  Filled 2014-05-08: qty 2

## 2014-05-08 MED ORDER — TRAMADOL HCL 50 MG PO TABS
50.0000 mg | ORAL_TABLET | Freq: Four times a day (QID) | ORAL | Status: DC | PRN
  Administered 2014-05-08: 50 mg via ORAL
  Filled 2014-05-08: qty 1

## 2014-05-08 MED ORDER — TRAMADOL HCL 50 MG PO TABS: 50.0000 mg | ORAL_TABLET | Freq: Four times a day (QID) | ORAL | Status: DC | PRN

## 2014-05-08 MED ORDER — DICYCLOMINE HCL 20 MG PO TABS: 20.0000 mg | ORAL_TABLET | Freq: Four times a day (QID) | ORAL | Status: DC | PRN

## 2014-05-08 NOTE — ED Notes (Signed)
Presents with lower abdominal pain associtated with nausea, diarrhea. Pain began 2 months ago and is worse after eating even small amounts.  Diarrhea occurs 3 times per day, denies vomiting. "When I have this pain I feel my body is tired. Especially my feet. I can not walk with this pain" abdomen soft, non distended.

## 2014-05-08 NOTE — ED Provider Notes (Signed)
CSN: [id number]     Arrival date & time 05/08/14  0110 History   First MD Initiated Contact with Patient 05/08/14 [id number]     Chief Complaint  Patient presents with  . Abdominal Pain     (Consider location/radiation/quality/duration/timing/severity/associated sxs/prior Treatment) HPI 38 yo female presents to the ER from home with complaint of lower abdominal pain.  Pain has been ongoing for the last 3-4 months.  Pain occurs each time she eats, described as a crampy sensation, and associated with diarrhea.  No fevers, chills, nausea or vomiting.  All types of food cause pain.  When pain comes on, pt reports feeling weak all over, and has difficulty walking due to tired legs.  She has been taking ibuprofen, but it is no longer helping.  No urinary or vaginal complaints.  Pt s/p appendectomy and cholecystectomy.   Past Medical History  Diagnosis Date  . Thyroid disease     told had a thyroid cyst 11/11- needs recheck in 6 months, not done  . Gallstones   . Cholecystitis 04/2012   Past Surgical History  Procedure Laterality Date  . [place]end[place]      [place]  . Vaginal births      6 births- 1 set of twins 5867yrs- 6months  . Cholecystectomy  04/06/2012    Procedure: LAPAROSCOPIC CHOLECYSTECTOMY WITH INTRAOPERATIVE CHOLANGIOGRAM;  Surgeon: Atilano InaEric M Wilson, MD,FACS;  Location: [facility] OR;  Service: General;  Laterality: N/A;   Family History  Problem Relation Age of Onset  . Anesthesia problems Neg Hx    History  Substance Use Topics  . Smoking status: Never Smoker   . Smokeless tobacco: Never Used  . Alcohol Use: No   OB History   Grav Para Term Preterm Abortions TAB SAB Ect Mult Living   5 5 5  0 0 0 0 0 1 6     Review of Systems  All other systems reviewed and are negative.     Allergies  Review of patient's allergies indicates no known allergies.  Home Medications   Prior to Admission medications   Medication Sig Start Date End Date Taking? Authorizing Provider  ibuprofen  (ADVIL,MOTRIN) 200 MG tablet Take 200 mg by mouth every 6 (six) hours as needed for pain.   Yes Historical Provider, MD  PRESCRIPTION MEDICATION Take 1 tablet by mouth daily. Birth control   Yes Historical Provider, MD  dicyclomine (BENTYL) 20 MG tablet Take 1 tablet (20 mg total) by mouth every 6 (six) hours as needed for spasms (for abdominal cramping). 05/08/14   Olivia Mackielga M Darlena Koval, MD  traMADol (ULTRAM) 50 MG tablet Take 1 tablet (50 mg total) by mouth every 6 (six) hours as needed for moderate pain or severe pain. 05/08/14   Olivia Mackielga M Delissa Silba, MD   BP 94/59  Pulse 76  Temp(Src) 97.9 F (36.6 C) (Oral)  Resp 18  SpO2 99%  LMP 04/25/2014 Physical Exam  Nursing note and vitals reviewed. Constitutional: She is oriented to person, place, and time. She appears well-developed and well-nourished.  HENT:  Head: Normocephalic and atraumatic.  Right Ear: External ear normal.  Left Ear: External ear normal.  Nose: Nose normal.  Mouth/Throat: Oropharynx is clear and moist.  Eyes: Conjunctivae and EOM are normal. Pupils are equal, round, and reactive to light.  Neck: Normal range of motion. Neck supple. No JVD present. No tracheal deviation present. No thyromegaly present.  Cardiovascular: Normal rate, regular rhythm, normal heart sounds and intact distal pulses.  Exam reveals no  gallop and no friction rub.   No murmur heard. Pulmonary/Chest: Effort normal and breath sounds normal. No stridor. No respiratory distress. She has no wheezes. She has no rales. She exhibits no tenderness.  Abdominal: Soft. Bowel sounds are normal. She exhibits no distension and no mass. There is tenderness (mild lower abd pain, no rebound or guarding). There is no rebound and no guarding.  Musculoskeletal: Normal range of motion. She exhibits no edema and no tenderness.  Lymphadenopathy:    She has no cervical adenopathy.  Neurological: She is alert and oriented to person, place, and time. She exhibits normal muscle tone.  Coordination normal.  Skin: Skin is warm and dry. No rash noted. No erythema. No pallor.  Psychiatric: She has a normal mood and affect. Her behavior is normal. Judgment and thought content normal.    ED Course  Procedures (including critical care time) Labs Review Labs Reviewed  COMPREHENSIVE METABOLIC PANEL - Abnormal; Notable for the following:    Potassium 3.6 (*)    Glucose, Bld 107 (*)    Albumin 3.1 (*)    Total Bilirubin <0.2 (*)    All other components within normal limits  CBC WITH DIFFERENTIAL - Abnormal; Notable for the following:    RBC 3.69 (*)    Hemoglobin 11.5 (*)    HCT 34.3 (*)    All other components within normal limits  URINALYSIS, ROUTINE W REFLEX MICROSCOPIC - Abnormal; Notable for the following:    APPearance CLOUDY (*)    Hgb urine dipstick SMALL (*)    Leukocytes, UA SMALL (*)    All other components within normal limits  URINE MICROSCOPIC-ADD ON - Abnormal; Notable for the following:    Squamous Epithelial / LPF FEW (*)    Bacteria, UA FEW (*)    All other components within normal limits  POC URINE PREG, ED    Imaging Review No results found.   EKG Interpretation None      MDM   Final diagnoses:  Lower abdominal pain  Diarrhea    38 year old female with ongoing lower abdominal cramping with diarrhea after meals for the last several months.  Workup here unremarkable.  Will refer to GI for further workup for possible irritable bowel syndrome.  Patient instructed to keep a food diary to see if she has specific food triggers.  Will start on Bentyl and tramadol.    Olivia Mackielga M Juddson Cobern, MD 05/08/14 [id number]

## 2014-05-08 NOTE — ED Notes (Signed)
Patient states that she gets this really sharp stabbing pain in the lower part of her stomach then she has to go to the bathroom

## 2014-05-08 NOTE — Discharge Instructions (Signed)
Please keep a food diary, to see if certain medications cause your abdominal pain and diarrhea worse than others.  Return to the ER for worsening condition or new concerning symptoms.  Take medications as prescribed.   Abdominal Pain Many things can cause abdominal pain. Usually, abdominal pain is not caused by a disease and will improve without treatment. It can often be observed and treated at home. Your health care provider will do a physical exam and possibly order blood tests and X-rays to help determine the seriousness of your pain. However, in many cases, more time must pass before a clear cause of the pain can be found. Before that point, your health care provider may not know if you need more testing or further treatment. HOME CARE INSTRUCTIONS  Monitor your abdominal pain for any changes. The following actions may help to alleviate any discomfort you are experiencing:  Only take over-the-counter or prescription medicines as directed by your health care provider.  Do not take laxatives unless directed to do so by your health care provider.  Try a clear liquid diet (broth, tea, or water) as directed by your health care provider. Slowly move to a bland diet as tolerated. SEEK MEDICAL CARE IF:  You have unexplained abdominal pain.  You have abdominal pain associated with nausea or diarrhea.  You have pain when you urinate or have a bowel movement.  You experience abdominal pain that wakes you in the night.  You have abdominal pain that is worsened or improved by eating food.  You have abdominal pain that is worsened with eating fatty foods.  You have a fever. SEEK IMMEDIATE MEDICAL CARE IF:   Your pain does not go away within 2 hours.  You keep throwing up (vomiting).  Your pain is felt only in portions of the abdomen, such as the right side or the left lower portion of the abdomen.  You pass bloody or black tarry stools. MAKE SURE YOU:  Understand these instructions.    Will watch your condition.   Will get help right away if you are not doing well or get worse.  Document Released: 07/28/2005 Document Revised: 10/23/2013 Document Reviewed: 06/27/2013 [facility] Patient Information 2015 [place], [place]. This information is not intended to replace advice given to you by your health care provider. Make sure you discuss any questions you have with your health care provider.  Chronic Diarrhea Diarrhea is frequent loose and watery bowel movements. It can cause you to feel weak and dehydrated. Dehydration can cause you to become tired and thirsty and to have a dry mouth, decreased urination, and dark yellow urine. Diarrhea is a sign of another problem, most often an infection that will not last long. In most cases, diarrhea lasts 2-3 days. Diarrhea that lasts longer than 4 weeks is called long-lasting (chronic) diarrhea. It is important to treat your diarrhea as directed by your health care provider to lessen or prevent future episodes of diarrhea.  CAUSES  There are many causes of chronic diarrhea. The following are some possible causes:   Gastrointestinal infections caused by viruses, bacteria, or parasites.   Food poisoning or food allergies.   Certain medicines, such as antibiotics, chemotherapy, and laxatives.   Artificial sweeteners and fructose.   Digestive disorders, such as celiac disease and inflammatory bowel diseases.   Irritable bowel syndrome.  Some disorders of the pancreas.  Disorders of the thyroid.  Reduced blood flow to the intestines.  Cancer. Sometimes the cause of chronic diarrhea is unknown.  RISK FACTORS  Having a severely weakened immune system, such as from HIV or AIDS.   Taking certain types of cancer-fighting drugs (such as with chemotherapy) or other medicines.   Having had a recent organ transplant.   Having a portion of the stomach or small bowel removed.   Traveling to countries where food and water  supplies are often contaminated.  SYMPTOMS  In addition to frequent, loose stools, diarrhea may cause:   Cramping.   Abdominal pain.   Nausea.   Fever.  Fatigue.  Urgent need to use the bathroom.  Loss of bowel control. DIAGNOSIS  Your health care provider must take a careful history and perform a physical exam. Tests given are based on your symptoms and history. Tests may include:   Blood or stool tests. Three or more stool samples may be examined. Stool cultures may be used to test for bacteria or parasites.   X-rays.   A procedure in which a thin tube is inserted into the mouth or rectum (endoscopy). This allows the health care provider to look inside the intestine.  TREATMENT   Treatment is aimed at correcting the cause of the diarrhea when possible.  Diarrhea caused by an infection can often be treated with antibiotic medicines.  Diarrhea not caused by an infection may require you to take long-term medicine or have surgery. Specific treatment should be discussed with your health care provider.  If the cause cannot be determined, treatment aims to relieve symptoms and prevent dehydration. Serious health problems can occur if you do not maintain proper fluid levels. Treatment may include:  Taking an oral rehydration solution (ORS).  Not drinking beverages that contain caffeine (such as tea, coffee, and soft drinks).  Not drinking alcohol.  Maintaining well-balanced nutrition to help you recover faster. HOME CARE INSTRUCTIONS   Drink enough fluids to keep urine clear or pale yellow. Drink 1 cup (8 oz) of fluid for each diarrhea episode. Avoid fluids that contain simple sugars, fruit juices, whole milk products, and sodas. Hydrate with an ORS. You may purchase the ORS or prepare it at home by mixing the following ingredients together:   - tsp (1.7-3  mL) table salt.   tsp (3  mL) baking soda.   tsp (1.7 mL) salt substitute containing potassium  chloride.  1 tbsp (20 mL) sugar.  4.2 c (1 L) of water.   Certain foods and beverages may increase the speed at which food moves through the gastrointestinal (GI) tract. These foods and beverages should be avoided. They include:  Caffeinated and alcoholic beverages.  High-fiber foods, such as raw fruits and vegetables, nuts, seeds, and whole grain breads and cereals.  Foods and beverages sweetened with sugar alcohols, such as xylitol, sorbitol, and mannitol.   Some foods may be well tolerated and may help thicken stool. These include:  Starchy foods, such as rice, toast, pasta, low-sugar cereal, oatmeal, grits, baked potatoes, crackers, and bagels.  Bananas.  Applesauce.  Add probiotic-rich foods to help increase healthy bacteria in the GI tract. These include yogurt and fermented milk products.  Wash your hands well after each diarrhea episode.  Only take over-the-counter or prescription medicines as directed by your health care provider.  Take a warm bath to relieve any burning or pain from frequent diarrhea episodes. SEEK MEDICAL CARE IF:   You are not urinating as often.  Your urine is a dark color.  You become very tired or dizzy.  You have severe pain in the abdomen  or rectum.  Your have blood or pus in your stools.  Your stools look black and tarry. SEEK IMMEDIATE MEDICAL CARE IF:   You are unable to keep fluids down.  You have persistent vomiting.  You have blood in your stool.  Your stools are black and tarry.  You do not urinate in 6-8 hours, or there is only a small amount of very dark urine.  You have abdominal pain that increases or localizes.  You have weakness, dizziness, confusion, or lightheadedness.  You have a severe headache.  Your diarrhea gets worse or does not get better.  You have a fever or persistent symptoms for more than 2-3 days.  You have a fever and your symptoms suddenly get worse. MAKE SURE YOU:   Understand these  instructions.  Will watch your condition.  Will get help right away if you are not doing well or get worse. Document Released: 01/08/2004 Document Revised: 10/23/2013 Document Reviewed: 04/12/2013 [facility] Patient Information 2015 [place], [place]. This information is not intended to replace advice given to you by your health care provider. Make sure you discuss any questions you have with your health care provider.  Diet and Irritable Bowel Syndrome  No cure has been found for irritable bowel syndrome (IBS). Many options are available to treat the symptoms. Your caregiver will give you the best treatments available for your symptoms. He or she will also encourage you to manage stress and to make changes to your diet. You need to work with your caregiver and Registered Dietician to find the best combination of medicine, diet, counseling, and support to control your symptoms. The following are some diet suggestions. FOODS THAT MAKE IBS WORSE  Fatty foods, such as [place] fries.  Milk products, such as cheese or ice cream.  Chocolate.  Alcohol.  Caffeine (found in coffee and some sodas).  Carbonated drinks, such as soda. If certain foods cause symptoms, you should eat less of them or stop eating them. FOOD JOURNAL   Keep a journal of the foods that seem to cause distress. Write down:  What you are eating during the day and when.  What problems you are having after eating.  When the symptoms occur in relation to your meals.  What foods always make you feel badly.  Take your notes with you to your caregiver to see if you should stop eating certain foods. FOODS THAT MAKE IBS BETTER Fiber reduces IBS symptoms, especially constipation, because it makes stools soft, bulky, and easier to pass. Fiber is found in bran, bread, cereal, beans, fruit, and vegetables. Examples of foods with fiber include:  Apples.  Peaches.  Pears.  Berries.  Figs.  Broccoli,  raw.  Cabbage.  Carrots.  Raw peas.  Kidney beans.  Lima beans.  Whole-grain bread.  Whole-grain cereal. Add foods with fiber to your diet a little at a time. This will let your body get used to them. Too much fiber at once might cause gas and swelling of your abdomen. This can trigger symptoms in a person with IBS. Caregivers usually recommend a diet with enough fiber to produce soft, painless bowel movements. High fiber diets may cause gas and bloating. However, these symptoms often go away within a few weeks, as your body adjusts. In many cases, dietary fiber may lessen IBS symptoms, particularly constipation. However, it may not help pain or diarrhea. High fiber diets keep the colon mildly enlarged (distended) with the added fiber. This may help prevent spasms in the colon.  Some forms of fiber also keep water in the stool, thereby preventing hard stools that are difficult to pass.  Besides telling you to eat more foods with fiber, your caregiver may also tell you to get more fiber by taking a fiber pill or drinking water mixed with a special high fiber powder. An example of this is a natural fiber laxative containing psyllium seed.  TIPS  Large meals can cause cramping and diarrhea in people with IBS. If this happens to you, try eating 4 or 5 small meals a day, or try eating less at each of your usual 3 meals. It may also help if your meals are low in fat and high in carbohydrates. Examples of carbohydrates are pasta, rice, whole-grain breads and cereals, fruits, and vegetables.  If dairy products cause your symptoms to flare up, you can try eating less of those foods. You might be able to handle yogurt better than other dairy products, because it contains bacteria that helps with digestion. Dairy products are an important source of calcium and other nutrients. If you need to avoid dairy products, be sure to talk with a Registered Dietitian about getting these nutrients through other food  sources.  Drink enough water and fluids to keep your urine clear or pale yellow. This is important, especially if you have diarrhea. FOR MORE INFORMATION  International Foundation for Functional Gastrointestinal Disorders: www.iffgd.org  National Digestive Diseases Information Clearinghouse: digestive.StageSync.si Document Released: 01/08/2004 Document Revised: 01/10/2012 Document Reviewed: 09/25/2007 [facility] Patient Information 2015 [place], [place]. This information is not intended to replace advice given to you by your health care provider. Make sure you discuss any questions you have with your health care provider.  Irritable Bowel Syndrome Irritable bowel syndrome (IBS) is caused by a disturbance of normal bowel function and is a common digestive disorder. You may also hear this condition called spastic colon, mucous colitis, and irritable colon. There is no cure for IBS. However, symptoms often gradually improve or disappear with a good diet, stress management, and medicine. This condition usually appears in late adolescence or early adulthood. Women develop it twice as often as men. CAUSES  After food has been digested and absorbed in the small intestine, waste material is moved into the large intestine, or colon. In the colon, water and salts are absorbed from the undigested products coming from the small intestine. The remaining residue, or fecal material, is held for elimination. Under normal circumstances, gentle, rhythmic contractions of the bowel walls push the fecal material along the colon toward the rectum. In IBS, however, these contractions are irregular and poorly coordinated. The fecal material is either retained too long, resulting in constipation, or expelled too soon, producing diarrhea. SIGNS AND SYMPTOMS  The most common symptom of IBS is abdominal pain. It is often in the lower left side of the abdomen, but it may occur anywhere in the abdomen. The pain comes from spasms of  the bowel muscles happening too much and from the buildup of gas and fecal material in the colon. This pain:  Can range from sharp abdominal cramps to a dull, continuous ache.  Often worsens soon after eating.  Is often relieved by having a bowel movement or passing gas. Abdominal pain is usually accompanied by constipation, but it may also produce diarrhea. The diarrhea often occurs right after a meal or upon waking up in the morning. The stools are often soft, watery, and flecked with mucus. Other symptoms of IBS include:  Bloating.  Loss of  appetite.  Heartburn.  Backache.  Dull pain in the arms or shoulders.  Nausea.  Burping.  Vomiting.  Gas. IBS may also cause symptoms that are unrelated to the digestive system, such as:  Fatigue.  Headaches.  Anxiety.  Shortness of breath.  Trouble concentrating.  Dizziness. These symptoms tend to come and go. DIAGNOSIS  The symptoms of IBS may seem like symptoms of other, more serious digestive disorders. Your health care provider may want to perform tests to exclude these disorders.  TREATMENT Many medicines are available to help correct bowel function or relieve bowel spasms and abdominal pain. Among the medicines available are:  Laxatives for severe constipation and to help restore normal bowel habits.  Specific antidiarrheal medicines to treat severe or lasting diarrhea.  Antispasmodic agents to relieve intestinal cramps. Your health care provider may also decide to treat you with a mild tranquilizer or sedative during unusually stressful periods in your life. Your health care provider may also prescribe antidepressant medicine. The use of this medicine has been shown to reduce pain and other symptoms of IBS. Remember that if any medicine is prescribed for you, you should take it exactly as directed. Make sure your health care provider knows how well it worked for you. HOME CARE INSTRUCTIONS   Take all medicines as  directed by your health care provider.  Avoid foods that are high in fat or oils, such as heavy cream, butter, frankfurters, sausage, and other fatty meats.  Avoid foods that make you go to the bathroom, such as fruit, fruit juice, and dairy products.  Cut out carbonated drinks, chewing gum, and "gassy" foods such as beans and cabbage. This may help relieve bloating and burping.  Eat foods with bran, and drink plenty of liquids with the bran foods. This helps relieve constipation.  Keep track of what foods seem to bring on your symptoms.  Avoid emotionally charged situations or circumstances that produce anxiety.  Start or continue exercising.  Get plenty of rest and sleep. Document Released: 10/18/2005 Document Revised: 10/23/2013 Document Reviewed: 06/07/2008 [facility] Patient Information 2015 [place], [place]. This information is not intended to replace advice given to you by your health care provider. Make sure you discuss any questions you have with your health care provider.

## 2014-09-02 ENCOUNTER — Encounter (HOSPITAL_COMMUNITY): Payer: Self-pay | Admitting: Emergency Medicine

## 2015-02-09 ENCOUNTER — Emergency Department (HOSPITAL_COMMUNITY)
Admission: EM | Admit: 2015-02-09 | Discharge: 2015-02-09 | Disposition: A | Discharge status: Home / Self Care | Payer: No Typology Code available for payment source | Attending: Emergency Medicine | Admitting: Emergency Medicine

## 2015-02-09 ENCOUNTER — Emergency Department (HOSPITAL_COMMUNITY): Payer: No Typology Code available for payment source

## 2015-02-09 ENCOUNTER — Encounter (HOSPITAL_COMMUNITY): Payer: Self-pay | Admitting: *Deleted

## 2015-02-09 DIAGNOSIS — Y929 Unspecified place or not applicable: Secondary | ICD-10-CM | POA: Insufficient documentation

## 2015-02-09 DIAGNOSIS — Z8639 Personal history of other endocrine, nutritional and metabolic disease: Secondary | ICD-10-CM | POA: Insufficient documentation

## 2015-02-09 DIAGNOSIS — S8991XA Unspecified injury of right lower leg, initial encounter: Secondary | ICD-10-CM | POA: Insufficient documentation

## 2015-02-09 DIAGNOSIS — M25561 Pain in right knee: Secondary | ICD-10-CM

## 2015-02-09 DIAGNOSIS — Y939 Activity, unspecified: Secondary | ICD-10-CM | POA: Insufficient documentation

## 2015-02-09 DIAGNOSIS — Z8719 Personal history of other diseases of the digestive system: Secondary | ICD-10-CM | POA: Insufficient documentation

## 2015-02-09 DIAGNOSIS — Y999 Unspecified external cause status: Secondary | ICD-10-CM | POA: Insufficient documentation

## 2015-02-09 DIAGNOSIS — W1840XA Slipping, tripping and stumbling without falling, unspecified, initial encounter: Secondary | ICD-10-CM | POA: Insufficient documentation

## 2015-02-09 MED ORDER — HYDROCODONE-ACETAMINOPHEN 5-325 MG PO TABS: 1.0000 | ORAL_TABLET | Freq: Four times a day (QID) | ORAL | Status: DC | PRN

## 2015-02-09 NOTE — ED Notes (Signed)
Pt c/o R knee pain x 3 days; worsening today. Mild swelling noted to knee

## 2015-02-09 NOTE — ED Provider Notes (Signed)
CSN: [id number]     Arrival date & time 02/09/15  1632 History  This chart was scribed for non-physician practitioner, Roxy Horseman, PA-C working with Purvis Sheffield, MD by Gwenyth Ober, ED scribe. This patient was seen in room TR07C/TR07C and the patient's care was started at 4:53 PM    Chief Complaint  Patient presents with  . Knee Injury   The history is provided by the patient. No language interpreter was used.   HPI Comments: Danielle Cohen is a 39 y.o. female who presents to the Emergency Department complaining of constant, moderate right knee pain that started 3 days after she slipped in a puddle of water and landed on her knee. She states limping with ambulation as an associated symptom. Pt has tried OTC pain medication with some relief. She denies swelling as an associated symptom.  Past Medical History  Diagnosis Date  . Thyroid disease     told had a thyroid cyst 11/11- needs recheck in 6 months, not done  . Gallstones   . Cholecystitis 04/2012   Past Surgical History  Procedure Laterality Date  . Appendectomy      in [place]  . Vaginal births      6 births- 1 set of twins 97yrs- 6months  . Cholecystectomy  04/06/2012    Procedure: LAPAROSCOPIC CHOLECYSTECTOMY WITH INTRAOPERATIVE CHOLANGIOGRAM;  Surgeon: Atilano Ina, MD,FACS;  Location: [facility] OR;  Service: General;  Laterality: N/A;   Family History  Problem Relation Age of Onset  . Anesthesia problems Neg Hx    History  Substance Use Topics  . Smoking status: Never Smoker   . Smokeless tobacco: Never Used  . Alcohol Use: No   OB History    Gravida Para Term Preterm AB TAB SAB Ectopic Multiple Living   0 0 0 0 0 1 6     Review of Systems  Constitutional: Negative for fever and chills.  Respiratory: Negative for shortness of breath.   Cardiovascular: Negative for chest pain.  Gastrointestinal: Negative for nausea, vomiting, diarrhea and constipation.  Genitourinary: Negative for dysuria.   Musculoskeletal: Positive for back pain and arthralgias.  Skin: Negative for wound.      Allergies  Review of patient's allergies indicates no known allergies.  Home Medications   Prior to Admission medications   Medication Sig Start Date End Date Taking? Authorizing Provider  dicyclomine (BENTYL) 20 MG tablet Take 1 tablet (20 mg total) by mouth every 6 (six) hours as needed for spasms (for abdominal cramping). 05/08/14   Marisa Severin, MD  ibuprofen (ADVIL,MOTRIN) 200 MG tablet Take 200 mg by mouth every 6 (six) hours as needed for pain.    Historical Provider, MD  PRESCRIPTION MEDICATION Take 1 tablet by mouth daily. Birth control    Historical Provider, MD  traMADol (ULTRAM) 50 MG tablet Take 1 tablet (50 mg total) by mouth every 6 (six) hours as needed for moderate pain or severe pain. 05/08/14   Marisa Severin, MD   BP 111/76 mmHg  Pulse 88  Temp(Src) 97.8 F (36.6 C)  Resp 16  SpO2 97%  LMP 01/28/2015 Physical Exam  Constitutional: She appears well-developed and well-nourished. No distress.  HENT:  Head: Normocephalic and atraumatic.  Eyes: Conjunctivae and EOM are normal.  Neck: Neck supple. No tracheal deviation present.  Cardiovascular: Normal rate and intact distal pulses.   Intact distal pulses with brisk capillary refill  Pulmonary/Chest: Effort normal. No respiratory distress.  Musculoskeletal: She exhibits tenderness.  Right knee TTP over the anterolateral aspect and inferomedial aspect; no bony abnormality or deformity; ROM and strength 5/5; mild clicking with ROM; Stability testing limited secondary to body habitus and guarding  Neurological:  Sensation intact  Skin: Skin is warm and dry.  Psychiatric: She has a normal mood and affect. Her behavior is normal.  Nursing note and vitals reviewed.   ED Course  Procedures   DIAGNOSTIC STUDIES: Oxygen Saturation is 97% on RA, normal by my interpretation.    COORDINATION OF CARE: 4:55 PM Discussed treatment plan  with pt which includes an x-ray of her right knee. Pt agreed to plan.  Labs Review Labs Reviewed - No data to display  Imaging Review Dg Knee Complete 4 Views Right  02/09/2015   CLINICAL DATA:  39 year old female fell 3 days ago. Pain. Initial encounter.  EXAM: RIGHT KNEE - COMPLETE 4+ VIEW  COMPARISON:  None.  FINDINGS: No fracture or dislocation.  Mild patellofemoral joint and medial tibiofemoral joint space degenerative changes.  Small joint effusion.  IMPRESSION: No fracture or dislocation.  Please see above.   Electronically Signed   By: Lacy DuverneySteven  Olson M.D.   On: 02/09/2015 17:49     EKG Interpretation None      MDM   Final diagnoses:  Knee pain, acute, right   Patient with right knee pain following mechanical fall 3 days ago.  There is associated clicking and popping.  Plain films are negative.  Concern for meniscal injury.  Will give knee immobilizer and crutches.  Recommend ortho follow-up.  Patient is stable and ready for discharge.  I personally performed the services described in this documentation, which was scribed in my presence. The recorded information has been reviewed and is accurate.     Roxy Horsemanobert Nivia Gervase, PA-C 02/09/15 1807  Purvis SheffieldForrest Harrison, MD 02/11/15 2134

## 2015-02-09 NOTE — ED Notes (Signed)
Declined W/C at D/C and was escorted to lobby by RN. 

## 2015-02-09 NOTE — Discharge Instructions (Signed)

## 2015-02-09 NOTE — ED Notes (Signed)
The pt fell 3 days ago onto her rt knee.  She is still having pain  lmp march 20

## 2015-03-01 ENCOUNTER — Encounter (HOSPITAL_COMMUNITY): Payer: Self-pay | Admitting: Emergency Medicine

## 2015-03-01 ENCOUNTER — Emergency Department (HOSPITAL_COMMUNITY): Payer: Self-pay

## 2015-03-01 DIAGNOSIS — Z8719 Personal history of other diseases of the digestive system: Secondary | ICD-10-CM | POA: Insufficient documentation

## 2015-03-01 DIAGNOSIS — Z8639 Personal history of other endocrine, nutritional and metabolic disease: Secondary | ICD-10-CM | POA: Insufficient documentation

## 2015-03-01 DIAGNOSIS — R079 Chest pain, unspecified: Secondary | ICD-10-CM | POA: Insufficient documentation

## 2015-03-01 DIAGNOSIS — Z79899 Other long term (current) drug therapy: Secondary | ICD-10-CM | POA: Insufficient documentation

## 2015-03-01 DIAGNOSIS — M25461 Effusion, right knee: Secondary | ICD-10-CM | POA: Insufficient documentation

## 2015-03-01 DIAGNOSIS — G8929 Other chronic pain: Secondary | ICD-10-CM | POA: Insufficient documentation

## 2015-03-01 LAB — I-STAT TROPONIN, ED: Troponin i, poc: 0 ng/mL (ref 0.00–0.08)

## 2015-03-01 NOTE — ED Notes (Addendum)
Pt reports increased right knee pain that radiates to right thigh and hip; pt reports was seen here before for same problem and unable to make appt with specialist; pt reports using assistive device (crutches) for ambulation; swelling noted  Pt also reports that knee pain radiates central to right sided chest and back area with increasing sob; pt reports new onset lightheadedness in triage

## 2015-03-02 ENCOUNTER — Emergency Department (HOSPITAL_COMMUNITY): Payer: Self-pay

## 2015-03-02 ENCOUNTER — Emergency Department (HOSPITAL_COMMUNITY): Payer: No Typology Code available for payment source

## 2015-03-02 ENCOUNTER — Emergency Department (HOSPITAL_COMMUNITY)
Admission: EM | Admit: 2015-03-02 | Discharge: 2015-03-02 | Disposition: A | Discharge status: Home / Self Care | Payer: Self-pay | Attending: Emergency Medicine | Admitting: Emergency Medicine

## 2015-03-02 ENCOUNTER — Encounter (HOSPITAL_COMMUNITY): Payer: Self-pay | Admitting: Radiology

## 2015-03-02 DIAGNOSIS — R079 Chest pain, unspecified: Secondary | ICD-10-CM

## 2015-03-02 DIAGNOSIS — M25461 Effusion, right knee: Secondary | ICD-10-CM

## 2015-03-02 LAB — CBC
HCT: 35.2 % — ABNORMAL LOW (ref 36.0–46.0)
HEMOGLOBIN: 11.6 g/dL — AB (ref 12.0–15.0)
MCH: 30.8 pg (ref 26.0–34.0)
MCHC: 33 g/dL (ref 30.0–36.0)
MCV: 93.4 fL (ref 78.0–100.0)
Platelets: 438 10*3/uL — ABNORMAL HIGH (ref 150–400)
RBC: 3.77 MIL/uL — ABNORMAL LOW (ref 3.87–5.11)
RDW: 13 % (ref 11.5–15.5)
WBC: 7.5 10*3/uL (ref 4.0–10.5)

## 2015-03-02 LAB — BASIC METABOLIC PANEL
ANION GAP: 7 (ref 5–15)
BUN: 13 mg/dL (ref 6–23)
CALCIUM: 8.9 mg/dL (ref 8.4–10.5)
CO2: 25 mmol/L (ref 19–32)
Chloride: 102 mmol/L (ref 96–112)
Creatinine, Ser: 0.57 mg/dL (ref 0.50–1.10)
GFR calc Af Amer: >90 mL/min (ref >90)
GFR calc non Af Amer: >90 mL/min (ref >90)
GLUCOSE: 108 mg/dL — AB (ref 70–99)
Potassium: 3.7 mmol/L (ref 3.5–5.1)
SODIUM: 134 mmol/L — AB (ref 135–145)

## 2015-03-02 LAB — D-DIMER, QUANTITATIVE (NOT AT ARMC): D DIMER QUANT: 1.05 ug{FEU}/mL — AB (ref 0.00–0.48)

## 2015-03-02 MED ORDER — OXYCODONE-ACETAMINOPHEN 5-325 MG PO TABS
2.0000 | ORAL_TABLET | Freq: Once | ORAL | Status: AC
  Administered 2015-03-02: 2 via ORAL
  Filled 2015-03-02: qty 2

## 2015-03-02 MED ORDER — OXYCODONE-ACETAMINOPHEN 5-325 MG PO TABS: 1.0000 | ORAL_TABLET | Freq: Two times a day (BID) | ORAL | Status: DC | PRN

## 2015-03-02 MED ORDER — IOHEXOL 350 MG/ML SOLN
80.0000 mL | Freq: Once | INTRAVENOUS | Status: AC | PRN
  Administered 2015-03-02: 100 mL via INTRAVENOUS

## 2015-03-02 MED ORDER — SODIUM CHLORIDE 0.9 % IV BOLUS (SEPSIS)
1000.0000 mL | Freq: Once | INTRAVENOUS | Status: AC
  Administered 2015-03-02: 1000 mL via INTRAVENOUS

## 2015-03-02 NOTE — Discharge Instructions (Signed)
Knee Effusion Danielle Cohen, your CT scan results are below and does not show any blood clots. There is a nodule on the left side of your lung, you need to repeat a CT scan within 6 months to follow this up. Continue to take pain medication as prescribed for your knee. Use ice and elevation as well. See a primary care physician within 3 days for follow-up. If symptoms worsen come back to the emergency department immediately. Thank you.  IMPRESSION: No acute pulmonary embolism, no specific findings to explain RIGHT chest pain.  9 mm LEFT apical subpleural ground-glass nodule may be infectious or inflammatory, though, recommend followup CT of the chest in 3 months to verify stability of findings.  Partially imaged 2 cm RIGHT thyroid nodule for which follow up thyroid sonogram is recommended on a nonemergent basis.    Knee effusion means you have fluid in your knee. The knee may be more difficult to bend and move. HOME CARE  Use crutches or a brace as told by your doctor.  Put ice on the injured area.  Put ice in a plastic bag.  Place a towel between your skin and the bag.  Leave the ice on for 15-20 minutes, 03-04 times a day.  Raise (elevate) your knee as much as possible.  Only take medicine as told by your doctor.  You may need to do strengthening exercises. Ask your doctor.  Continue with your normal diet and activities as told by your doctor. GET HELP RIGHT AWAY IF:  You have more puffiness (swelling) in your knee.  You see redness, puffiness, or have more pain in your knee.  You have a temperature by mouth above 102 F (38.9 C).  You get a rash.  You have trouble breathing.  You have a reaction to any medicine you are taking.  You have a lot of pain when you move your knee. MAKE SURE YOU:  Understand these instructions.  Will watch your condition.  Will get help right away if you are not doing well or get worse. Document Released: 11/20/2010 Document  Revised: 01/10/2012 Document Reviewed: 11/20/2010 [facility] Patient Information 2015 [employer] [place]. This information is not intended to replace advice given to you by your health care provider. Make sure you discuss any questions you have with your health care provider. Chest Pain (Nonspecific) It is often hard to give a diagnosis for the cause of chest pain. There is always a chance that your pain could be related to something serious, such as a heart attack or a blood clot in the lungs. You need to follow up with your doctor. HOME CARE  If antibiotic medicine was given, take it as directed by your doctor. Finish the medicine even if you start to feel better.  For the next few days, avoid activities that bring on chest pain. Continue physical activities as told by your doctor.  Do not use any tobacco products. This includes cigarettes, chewing tobacco, and e-cigarettes.  Avoid drinking alcohol.  Only take medicine as told by your doctor.  Follow your doctor's suggestions for more testing if your chest pain does not go away.  Keep all doctor visits you made. GET HELP IF:  Your chest pain does not go away, even after treatment.  You have a rash with blisters on your chest.  You have a fever. GET HELP RIGHT AWAY IF:   You have more pain or pain that spreads to your arm, neck, jaw, back, or belly (abdomen).  You  have shortness of breath.  You cough more than usual or cough up blood.  You have very bad back or belly pain.  You feel sick to your stomach (nauseous) or throw up (vomit).  You have very bad weakness.  You pass out (faint).  You have chills. This is an emergency. Do not wait to see if the problems will go away. Call your local emergency services (911 in [place] Do not drive yourself to the hospital. MAKE SURE YOU:   Understand these instructions.  Will watch your condition.  Will get help right away if you are not doing well or get worse. Document Released:  04/05/2008 Document Revised: 10/23/2013 Document Reviewed: 04/05/2008 [facility] Patient Information 2015 [place], [place]. This information is not intended to replace advice given to you by your health care provider. Make sure you discuss any questions you have with your health care provider.

## 2015-03-02 NOTE — ED Notes (Signed)
Pt. Left with all belongings and refused wheelchair 

## 2015-03-02 NOTE — ED Provider Notes (Signed)
CSN: [id number]     Arrival date & time 03/01/15  2227 History  This chart was scribed for Tomasita Crumble, MD by Phillis Haggis, ED Scribe. This patient was seen in room A12C/A12C and patient care was started at 12:32 AM.      Chief Complaint  Patient presents with  . Knee Pain  . Chest Pain   Patient is a 39 y.o. female presenting with knee pain and chest pain. The history is provided by the patient. No language interpreter was used.  Knee Pain Chest Pain  HPI Comments: Danielle Cohen is a 39 y.o. female with a history of thyroid disease who presents to the Emergency Department complaining of right knee pain onset one month ago. Patient states that her knee has become increasingly more painful. She reports falling a few months ago and states that the pain began to get better, but started to hurt again. She reports associated swelling to the knee and radiating pain to right thigh, hip and chest. She reports that it feels like a knife in her chest and is having SOB. She states that sometimes when she coughs, she feels "bubbles" going up from her stomach. She states taking 600 mg hydrocodone to some relief and 600 mg ibuprofen to no relief. She reports taking the ibuprofen at 10 PM PTA. She states that the hydrocodone works better for her. She states that she has tried to make an appointment with a doctor but is unable to make an appointment. She denies fever or abdominal pain. Patient denies taking any other medications or recent travel outside of the country.   Past Medical History  Diagnosis Date  . Thyroid disease     told had a thyroid cyst 11/11- needs recheck in 6 months, not done  . Gallstones   . Cholecystitis 04/2012   Past Surgical History  Procedure Laterality Date  . Appendectomy      in [place]  . Vaginal births      6 births- 1 set of twins 33yrs- 6months  . Cholecystectomy  04/06/2012    Procedure: LAPAROSCOPIC CHOLECYSTECTOMY WITH INTRAOPERATIVE CHOLANGIOGRAM;  Surgeon: Atilano Ina, MD,FACS;  Location: [facility] OR;  Service: General;  Laterality: N/A;   Family History  Problem Relation Age of Onset  . Anesthesia problems Neg Hx    History  Substance Use Topics  . Smoking status: Never Smoker   . Smokeless tobacco: Never Used  . Alcohol Use: No   OB History    Gravida Para Term Preterm AB TAB SAB Ectopic Multiple Living   0 0 0 0 0 1 6     Review of Systems  10 Systems reviewed and all are negative for acute change except as noted in the HPI.  Allergies  Review of patient's allergies indicates no known allergies.  Home Medications   Prior to Admission medications   Medication Sig Start Date End Date Taking? Authorizing Provider  dicyclomine (BENTYL) 20 MG tablet Take 1 tablet (20 mg total) by mouth every 6 (six) hours as needed for spasms (for abdominal cramping). 05/08/14   Marisa Severin, MD  HYDROcodone-acetaminophen (NORCO/VICODIN) 5-325 MG per tablet Take 1 tablet by mouth every 6 (six) hours as needed. 02/09/15   Roxy Horseman, PA-C  ibuprofen (ADVIL,MOTRIN) 200 MG tablet Take 200 mg by mouth every 6 (six) hours as needed for pain.    Historical Provider, MD  PRESCRIPTION MEDICATION Take 1 tablet by mouth daily. Birth control  Historical Provider, MD  traMADol (ULTRAM) 50 MG tablet Take 1 tablet (50 mg total) by mouth every 6 (six) hours as needed for moderate pain or severe pain. 05/08/14   Marisa Severinlga Otter, MD   BP 112/71 mmHg  Pulse 95  Temp(Src) 98.5 F (36.9 C) (Oral)  Resp 18  SpO2 99%  LMP 01/28/2015  Physical Exam  Constitutional: She is oriented to person, place, and time. She appears well-developed and well-nourished. No distress.  HENT:  Head: Normocephalic and atraumatic.  Nose: Nose normal.  Mouth/Throat: Oropharynx is clear and moist. No oropharyngeal exudate.  Eyes: Conjunctivae and EOM are normal. Pupils are equal, round, and reactive to light. No scleral icterus.  Neck: Normal range of motion. Neck supple. No JVD present. No  tracheal deviation present. No thyromegaly present.  Cardiovascular: Normal rate, regular rhythm and normal heart sounds.  Exam reveals no gallop and no friction rub.   No murmur heard. Pulmonary/Chest: Effort normal and breath sounds normal. No respiratory distress. She has no wheezes. She exhibits no tenderness.  Abdominal: Soft. Bowel sounds are normal. She exhibits no distension and no mass. There is no tenderness. There is no rebound and no guarding.  Musculoskeletal: Normal range of motion. She exhibits no edema or tenderness.  Right knee is swollen, no tenderness to palpation, no significant warmth, full ROM  Lymphadenopathy:    She has no cervical adenopathy.  Neurological: She is alert and oriented to person, place, and time. No cranial nerve deficit. She exhibits normal muscle tone.  Skin: Skin is warm and dry. No rash noted. No erythema. No pallor.  Nursing note and vitals reviewed.   ED Course  Procedures (including critical care time) DIAGNOSTIC STUDIES: Oxygen Saturation is 99% on room air, normal by my interpretation.    COORDINATION OF CARE: 12:39 AM-Discussed treatment plan which includes labs, chest x-ray, pain medication, RICE, referral for MRI with pt at bedside and pt agreed to plan.   Labs Review Labs Reviewed  CBC - Abnormal; Notable for the following:    RBC 3.77 (*)    Hemoglobin 11.6 (*)    HCT 35.2 (*)    Platelets 438 (*)    All other components within normal limits  BASIC METABOLIC PANEL - Abnormal; Notable for the following:    Sodium 134 (*)    Glucose, Bld 108 (*)    All other components within normal limits  D-DIMER, QUANTITATIVE - Abnormal; Notable for the following:    D-Dimer, Quant 1.05 (*)    All other components within normal limits  I-STAT TROPOININ, ED    Imaging Review Dg Knee Complete 4 Views Right  03/02/2015   CLINICAL DATA:  Pain and swelling for 4 months. No trauma history submitted.  EXAM: RIGHT KNEE - COMPLETE 4+ VIEW   COMPARISON:  02/09/2015  FINDINGS: Minimal 3 compartment joint space narrowing and osteophyte formation. No acute fracture or dislocation. Small suprapatellar joint effusion.  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   Electronically Signed   By: Jeronimo GreavesKyle  Talbot M.D.   On: 03/02/2015 00:14    EKG Interpretation  Date/Time:  Saturday March 01 2015 22:55:45 EDT Ventricular Rate:  95 PR Interval:  148 QRS Duration: 68 QT Interval:  334 QTC Calculation: 419 R Axis:   8 Text Interpretation:  Normal sinus rhythm Confirmed by Erroll Lunani, Palin Tristan  Ay[place]le [phone]([id number]) on 03/02/2015 12:28:15 AM      MDM   Final diagnoses:  None    Patient's  this emergency department for knee pain that has been chronic for the last month. Her knee exam does not seem consistent with a septic joint as there is no significant warmth. There is obvious swelling Richard (Proventil suprapatellar to injury. She has been taking hydrocodone for pain relief. Patient was given oxycodone in emergency department. X-rays were ordered by triage and did not reveal any significant injury. He was advised to follow-up with orthopedic surgery for continued management.  Patient also complains of chest pain on the right side is worse with a deep breath. D-dimer was positive, CT scan shows negative for pulmonary embolism. Patient was made aware of incidental nodule seen on the left side and need for follow-up. At this time the patient appears comfortable in the room and in no acute distress. Her vital signs remain within her normal limits and she is safe for discharge.   I personally performed the services described in this documentation, which was scribed in my presence. The recorded information has been reviewed and is accurate.   Tomasita Crumble, MD 03/02/15 [phone]

## 2015-03-11 ENCOUNTER — Ambulatory Visit: Payer: No Typology Code available for payment source | Attending: Family Medicine | Admitting: Family Medicine

## 2015-03-11 VITALS — BP 96/65 | HR 109 | Temp 97.6°F | Wt 200.2 lb

## 2015-03-11 DIAGNOSIS — E079 Disorder of thyroid, unspecified: Secondary | ICD-10-CM

## 2015-03-11 DIAGNOSIS — R079 Chest pain, unspecified: Secondary | ICD-10-CM | POA: Insufficient documentation

## 2015-03-11 DIAGNOSIS — M25561 Pain in right knee: Secondary | ICD-10-CM

## 2015-03-11 DIAGNOSIS — M25569 Pain in unspecified knee: Secondary | ICD-10-CM

## 2015-03-11 DIAGNOSIS — Z0000 Encounter for general adult medical examination without abnormal findings: Secondary | ICD-10-CM

## 2015-03-11 HISTORY — DX: Pain in right knee: M25.561

## 2015-03-11 HISTORY — DX: Chest pain, unspecified: R07.9

## 2015-03-11 LAB — COMPLETE METABOLIC PANEL WITH GFR
ALBUMIN: 3.8 g/dL (ref 3.5–5.2)
ALK PHOS: 51 U/L (ref 39–117)
ALT: 9 U/L (ref 0–35)
AST: 11 U/L (ref 0–37)
BUN: 8 mg/dL (ref 6–23)
CHLORIDE: 104 meq/L (ref 96–112)
CO2: 25 mEq/L (ref 19–32)
Calcium: 9.5 mg/dL (ref 8.4–10.5)
Creat: 0.58 mg/dL (ref 0.50–1.10)
GFR, Est African American: >89 mL/min
Glucose, Bld: 98 mg/dL (ref 70–99)
POTASSIUM: 4.4 meq/L (ref 3.5–5.3)
Sodium: 138 mEq/L (ref 135–145)
Total Bilirubin: 0.3 mg/dL (ref 0.2–1.2)
Total Protein: 7.2 g/dL (ref 6.0–8.3)

## 2015-03-11 LAB — T3, FREE: T3, Free: 2.6 pg/mL (ref 2.3–4.2)

## 2015-03-11 LAB — CBC WITH DIFFERENTIAL/PLATELET
BASOS ABS: 0.1 10*3/uL (ref 0.0–0.1)
Basophils Relative: 1 % (ref 0–1)
Eosinophils Absolute: 0.3 10*3/uL (ref 0.0–0.7)
Eosinophils Relative: 6 % — ABNORMAL HIGH (ref 0–5)
HEMATOCRIT: 38.1 % (ref 36.0–46.0)
HEMOGLOBIN: 12.8 g/dL (ref 12.0–15.0)
LYMPHS ABS: 1.5 10*3/uL (ref 0.7–4.0)
Lymphocytes Relative: 27 % (ref 12–46)
MCH: 30.5 pg (ref 26.0–34.0)
MCHC: 33.6 g/dL (ref 30.0–36.0)
MCV: 90.7 fL (ref 78.0–100.0)
MONO ABS: 0.4 10*3/uL (ref 0.1–1.0)
MPV: 8.6 fL (ref 8.6–12.4)
Monocytes Relative: 8 % (ref 3–12)
NEUTROS PCT: 58 % (ref 43–77)
Neutro Abs: 3.2 10*3/uL (ref 1.7–7.7)
Platelets: 503 10*3/uL — ABNORMAL HIGH (ref 150–400)
RBC: 4.2 MIL/uL (ref 3.87–5.11)
RDW: 13.5 % (ref 11.5–15.5)
WBC: 5.5 10*3/uL (ref 4.0–10.5)

## 2015-03-11 LAB — LIPID PANEL
CHOL/HDL RATIO: 2.7 ratio
Cholesterol: 171 mg/dL (ref 0–200)
HDL: 63 mg/dL (ref >=46)
LDL Cholesterol: 94 mg/dL (ref 0–99)
Triglycerides: 68 mg/dL (ref <150)
VLDL: 14 mg/dL (ref 0–40)

## 2015-03-11 LAB — TSH: TSH: 2.113 u[IU]/mL (ref 0.350–4.500)

## 2015-03-11 LAB — T4, FREE: Free T4: 1.12 ng/dL (ref 0.80–1.80)

## 2015-03-11 MED ORDER — NAPROXEN 500 MG PO TABS: 500.0000 mg | ORAL_TABLET | Freq: Two times a day (BID) | ORAL | Status: DC

## 2015-03-11 NOTE — Patient Instructions (Addendum)
1. Take naproxen 500 mg bid with food. 2.  We are arranging a thyroid scan at suggestion of ED doctor. 3.  We are doing blood work today and will follow-up with the results 4.  Will arrange orthopedist referral for knee 5.  Will arrange repeat lung scan in 3 months.

## 2015-03-11 NOTE — Progress Notes (Signed)
Patient ID: Danielle Cohen, female   DOB: 01/20/1976, 39 y.o.   MRN: [id number]   Danielle Cohen, is a 39 y.o. female  [id number]    DOB - 08/04/1976  CC: No chief complaint on file.       HPI: Patient presents to establish care after a recent ED visit for chest and knee pain. No definitive cause was found for the chest pain. CT scan show no PE. EKG showed no evidence for cardiac origin. Her D-mimer was positive but treponins negative. She fell about two months ago and continues to have right knee pain and is using crutches. She describes the pain and soreness with some sharp pains. She has been using a narcotic pain medication prescribed in the ED. She has not been treated with NSAID. She has not had PT and has not used heat or ice. Her chest CT with show a nodule and it is recommended to be rechecked in three months. There was also a thyroid nodule and a thyroid scan has been recommented.         No Known Allergies  Past Medical History  Diagnosis Date  . Thyroid disease     told had a thyroid cyst 11/11- needs recheck in 6 months, not done  . Gallstones   . Cholecystitis 04/2012   Brittania's family history is negative for Anesthesia problems.  History   Social History  . Marital Status: Married    Spouse Name: N/A  . Number of Children: N/A  . Years of Education: N/A   Occupational History  . Not on file.   Social History Main Topics  . Smoking status: Never Smoker   . Smokeless tobacco: Never Used  . Alcohol Use: No  . Drug Use: No  . Sexual Activity: Yes   Other Topics Concern  . Not on file   Social History Narrative   Past Surgical History  Procedure Laterality Date  . [place]      [place]  . Vaginal births      6 births- 1 set of twins 5742yrs- 6months  . Cholecystectomy  04/06/2012    Procedure: LAPAROSCOPIC CHOLECYSTECTOMY WITH INTRAOPERATIVE CHOLANGIOGRAM;  Surgeon: Atilano InaEric M Wilson, MD,FACS;  Location: [facility] OR;  Service: General;  Laterality: N/A;        ROS:  GEN:   Denies fever, chills,WT loss, loss of appetite. Positive for night sweats Skin:   Denies lesions or rashes HENT:   Denies  earache, epistaxis, sore throat, or neck pain, or headaches EYES:   Denies eye pain or drainage.               LUNGS:  Denies SOB with rest or walking; couging, choking CV:   Denies CP or palpitations ABD:   Denies abdominal pain, nausea,and vomiting, diarrhea  GU:   Denies frequency, urgency, dysuria          EXT:    Denies muscle spasms or swelling; no pain in lower ext,no unilateral  Weakness. Positive for knee pain on the right.  NEURO:   Denies numbness or tingling, denies seizures  Objective:  There were no vitals filed for this visit.  Physical Exam:  General:   Well-developed, well-nourished, in no acute distress Skin:      Warm and dry, no significant rashes.     Head :    Normocephalic, atraumatic, no pallor, Eyes:      No icterus, drainage, PERL, EOMS intact Ears:      Clear bilaterally,  TMs within normal limits. Nose:   nares patent w/o significant congestion or inflammation   Neck:      Supple FROM w/o adenopathy, tenderness, or thyroidomegaly. No JVD   or tracheal diviation Heart:     Normal  RRR,without M,G,R Lungs:    Clear to auscultation bilaterally. No increase in respiratory effort.No wheezes, rales or stridor Abdomen:  Soft, nontender, nondistended, positive bowel sounds, no guarding or  rebound Exetremeties:  No pedal edema.FROM, 2+pedal pulses, No unilateral weakness. There is swelling around the right knee cap. There is some discomfort with flexion and extension  Neuro:   Alert, oriented, appropriate, non-focal.  Pertinent Lab Results:  Lab Results  Component Value Date   WBC 7.5 03/01/2015   HGB 11.6* 03/01/2015   HCT 35.2* 03/01/2015   MCV 93.4 03/01/2015   PLT 438* 03/01/2015     Chemistry      Component Value Date/Time   NA 134* 03/01/2015 2253   K 3.7 03/01/2015 2253   CL 102 03/01/2015 2253    CO2 25 03/01/2015 2253   BUN 13 03/01/2015 2253   CREATININE 0.57 03/01/2015 2253      Component Value Date/Time   CALCIUM 8.9 03/01/2015 2253   ALKPHOS 46 05/08/2014 0135   AST 12 05/08/2014 0135   ALT 9 05/08/2014 0135   BILITOT <0.2* 05/08/2014 0135      No results found for: HGBA1C  Medications: Prior to Admission medications   Medication Sig Start Date End Date Taking? Authorizing Provider  dicyclomine (BENTYL) 20 MG tablet Take 1 tablet (20 mg total) by mouth every 6 (six) hours as needed for spasms (for abdominal cramping). Patient not taking: Reported on 03/02/2015 05/08/14   Marisa Severinlga Otter, MD  ibuprofen (ADVIL,MOTRIN) 200 MG tablet Take 200 mg by mouth every 6 (six) hours as needed for pain. Took 600mg  last night about 2200.    Historical Provider, MD  oxyCODONE-acetaminophen (PERCOCET/ROXICET) 5-325 MG per tablet Take 1 tablet by mouth 2 (two) times daily as needed for severe pain. 03/02/15   Tomasita CrumbleAdeleke Oni, MD  PRESCRIPTION MEDICATION Take 1 tablet by mouth daily. Birth control    Historical Provider, MD     Assessment 1. Chest pain resolved 2. Knee pain 3. Thyroid nodule 4. Lung lesion. 5. Need for routine health care  Plan:  1.Routine labs, Cmet with GFR, CBC, Lipid panel,  2. Referral for PT 3. Thyroid scal 4. Repeat CT scan in 3 months 5. I have asked her to schedule an appointment with her assigned PCP for ongoing care.   Follow up:  The patient was given clear instructions to go to ER or return to medical center if symptoms don't improve, worsen or new problems develop. The patient verbalized understanding.     Henrietta HooverLinda C. Bernhardt, FNP,BC 03/11/2015, 10:44 AM

## 2015-03-17 ENCOUNTER — Telehealth: Payer: Self-pay | Admitting: Family Medicine

## 2015-03-17 NOTE — Telephone Encounter (Signed)
Patient is calling to request a referral to PT, please f/u

## 2015-03-20 ENCOUNTER — Other Ambulatory Visit: Payer: Self-pay

## 2015-03-20 DIAGNOSIS — E041 Nontoxic single thyroid nodule: Secondary | ICD-10-CM

## 2015-03-21 ENCOUNTER — Telehealth: Payer: Self-pay | Admitting: General Practice

## 2015-03-21 NOTE — Telephone Encounter (Signed)
2nd Request Patient presents to clinic to follow up on referral request. Patient was seen by NP, Linda on 03/11/15 for right knee pain and chest pain. Patient states since her visit her knee pain has gotten progressively worst. Patient is requesting a referral for PT.  Please follow up with patient to assist.

## 2015-03-27 ENCOUNTER — Encounter (HOSPITAL_COMMUNITY): Payer: Self-pay | Admitting: Emergency Medicine

## 2015-03-27 ENCOUNTER — Emergency Department (HOSPITAL_COMMUNITY)
Admission: EM | Admit: 2015-03-27 | Discharge: 2015-03-27 | Disposition: A | Discharge status: Home / Self Care | Payer: No Typology Code available for payment source | Attending: Emergency Medicine | Admitting: Emergency Medicine

## 2015-03-27 DIAGNOSIS — Z8639 Personal history of other endocrine, nutritional and metabolic disease: Secondary | ICD-10-CM | POA: Insufficient documentation

## 2015-03-27 DIAGNOSIS — R059 Cough, unspecified: Secondary | ICD-10-CM

## 2015-03-27 DIAGNOSIS — Z791 Long term (current) use of non-steroidal anti-inflammatories (NSAID): Secondary | ICD-10-CM | POA: Insufficient documentation

## 2015-03-27 DIAGNOSIS — B349 Viral infection, unspecified: Secondary | ICD-10-CM

## 2015-03-27 DIAGNOSIS — Z8719 Personal history of other diseases of the digestive system: Secondary | ICD-10-CM | POA: Insufficient documentation

## 2015-03-27 DIAGNOSIS — R05 Cough: Secondary | ICD-10-CM

## 2015-03-27 DIAGNOSIS — Z79899 Other long term (current) drug therapy: Secondary | ICD-10-CM | POA: Insufficient documentation

## 2015-03-27 DIAGNOSIS — M7989 Other specified soft tissue disorders: Secondary | ICD-10-CM | POA: Insufficient documentation

## 2015-03-27 DIAGNOSIS — M6289 Other specified disorders of muscle: Secondary | ICD-10-CM

## 2015-03-27 LAB — BASIC METABOLIC PANEL WITH GFR
Anion gap: 5 (ref 5–15)
BUN: 10 mg/dL (ref 6–20)
CO2: 26 mmol/L (ref 22–32)
Calcium: 8.8 mg/dL — ABNORMAL LOW (ref 8.9–10.3)
Chloride: 108 mmol/L (ref 101–111)
Creatinine, Ser: 0.84 mg/dL (ref 0.44–1.00)
GFR calc Af Amer: >60 mL/min
GFR calc non Af Amer: >60 mL/min
Glucose, Bld: 99 mg/dL (ref 65–99)
Potassium: 3.8 mmol/L (ref 3.5–5.1)
Sodium: 139 mmol/L (ref 135–145)

## 2015-03-27 LAB — CBC WITH DIFFERENTIAL/PLATELET
BASOS PCT: 1 % (ref 0–1)
Basophils Absolute: 0 10*3/uL (ref 0.0–0.1)
EOS ABS: 0.4 10*3/uL (ref 0.0–0.7)
Eosinophils Relative: 6 % — ABNORMAL HIGH (ref 0–5)
HEMATOCRIT: 32.4 % — AB (ref 36.0–46.0)
HEMOGLOBIN: 10.6 g/dL — AB (ref 12.0–15.0)
LYMPHS ABS: 2 10*3/uL (ref 0.7–4.0)
Lymphocytes Relative: 32 % (ref 12–46)
MCH: 30.1 pg (ref 26.0–34.0)
MCHC: 32.7 g/dL (ref 30.0–36.0)
MCV: 92 fL (ref 78.0–100.0)
MONO ABS: 0.5 10*3/uL (ref 0.1–1.0)
Monocytes Relative: 8 % (ref 3–12)
NEUTROS ABS: 3.2 10*3/uL (ref 1.7–7.7)
Neutrophils Relative %: 53 % (ref 43–77)
Platelets: 402 10*3/uL — ABNORMAL HIGH (ref 150–400)
RBC: 3.52 MIL/uL — ABNORMAL LOW (ref 3.87–5.11)
RDW: 13.4 % (ref 11.5–15.5)
WBC: 6.1 10*3/uL (ref 4.0–10.5)

## 2015-03-27 MED ORDER — CYCLOBENZAPRINE HCL 10 MG PO TABS: 10.0000 mg | ORAL_TABLET | Freq: Two times a day (BID) | ORAL | Status: DC | PRN

## 2015-03-27 MED ORDER — SODIUM CHLORIDE 0.9 % IV BOLUS (SEPSIS)
1000.0000 mL | Freq: Once | INTRAVENOUS | Status: AC
  Administered 2015-03-27: 1000 mL via INTRAVENOUS

## 2015-03-27 MED ORDER — DIAZEPAM 5 MG/ML IJ SOLN
5.0000 mg | Freq: Once | INTRAMUSCULAR | Status: AC
  Administered 2015-03-27: 5 mg via INTRAMUSCULAR
  Filled 2015-03-27: qty 2

## 2015-03-27 NOTE — ED Provider Notes (Signed)
CSN: [id number]     Arrival date & time 03/27/15  1409 History   First MD Initiated Contact with Patient 03/27/15 1701     Chief Complaint  Patient presents with  . Neck Pain  . Fever     (Consider location/radiation/quality/duration/timing/severity/associated sxs/prior Treatment) HPI Comments: Patient presents to the emergency department with chief complaint of fever, cough, back pain, shoulder pain, neck pain 6 days. She states that the muscles in her back and neck felt tight and are sore. They are tender to palpation. She endorses sick contacts, both relatives and close family friends have been sick. She has tried taking ibuprofen with minimal relief. Symptoms are aggravated with movement. She denies any headache, numbness, weakness, or tingling. Denies nausea, vomiting, diarrhea, chest pain, or abdominal pain.  The history is provided by the patient. No language interpreter was used.    Past Medical History  Diagnosis Date  . Thyroid disease     told had a thyroid cyst 11/11- needs recheck in 6 months, not done  . Gallstones   . Cholecystitis 04/2012   Past Surgical History  Procedure Laterality Date  . Appendectomy      in [place]  . Vaginal births      6 births- 1 set of twins 97yrs- 6months  . Cholecystectomy  04/06/2012    Procedure: LAPAROSCOPIC CHOLECYSTECTOMY WITH INTRAOPERATIVE CHOLANGIOGRAM;  Surgeon: Atilano Ina, MD,FACS;  Location: [facility] OR;  Service: General;  Laterality: N/A;   Family History  Problem Relation Age of Onset  . Anesthesia problems Neg Hx    History  Substance Use Topics  . Smoking status: Never Smoker   . Smokeless tobacco: Never Used  . Alcohol Use: No   OB History    Gravida Para Term Preterm AB TAB SAB Ectopic Multiple Living   0 0 0 0 0 1 6     Review of Systems  Constitutional: Negative for fever and chills.  HENT: Negative for postnasal drip, rhinorrhea, sinus pressure, sneezing and sore throat.   Respiratory: Positive for  cough. Negative for shortness of breath.   Cardiovascular: Negative for chest pain.  Gastrointestinal: Negative for nausea, vomiting, abdominal pain, diarrhea and constipation.  Genitourinary: Negative for dysuria.  All other systems reviewed and are negative.     Allergies  Review of patient's allergies indicates no known allergies.  Home Medications   Prior to Admission medications   Medication Sig Start Date End Date Taking? Authorizing Provider  ibuprofen (ADVIL,MOTRIN) 200 MG tablet Take 600 mg by mouth every 6 (six) hours as needed for mild pain.   Yes Historical Provider, MD  naproxen (NAPROSYN) 500 MG tablet Take 1 tablet (500 mg total) by mouth 2 (two) times daily with a meal. 03/11/15  Yes Henrietta Hoover, NP  oxyCODONE-acetaminophen (PERCOCET/ROXICET) 5-325 MG per tablet Take 1 tablet by mouth 2 (two) times daily as needed for severe pain. Patient not taking: Reported on 03/27/2015 03/02/15   Tomasita Crumble, MD  PRESCRIPTION MEDICATION Take 1 tablet by mouth daily. Birth control    Historical Provider, MD   BP 93/58 mmHg  Pulse 100  Temp(Src) 98.1 F (36.7 C) (Oral)  Resp 18  SpO2 98%  LMP 03/11/2015 Physical Exam  Constitutional: She appears well-developed and well-nourished. No distress.  HENT:  Head: Normocephalic.  Right Ear: External ear normal.  Left Ear: External ear normal.  Mildly erythematous, no tonsillar exudate, no abscess, no stridor, uvula is midline  TMs clear bilaterally  Eyes: Conjunctivae and EOM are normal. Pupils are equal, round, and reactive to light.  Neck: Normal range of motion. Neck supple.   normal range of motion strengths, no meningismus  Cardiovascular: Normal rate, regular rhythm and normal heart sounds.  Exam reveals no gallop and no friction rub.   No murmur heard. Pulmonary/Chest: Effort normal and breath sounds normal. No stridor. No respiratory distress. She has no wheezes. She has no rales. She exhibits no tenderness.  CTAB   Abdominal: Soft. Bowel sounds are normal. She exhibits no distension. There is no tenderness.  Musculoskeletal: Normal range of motion. She exhibits no tenderness.  Cervical paraspinal muscles, upper trapezius, and rhomboids are tight and tender to palpation  Neurological: She is alert.  Skin: Skin is warm and dry. No rash noted. She is not diaphoretic.  Psychiatric: She has a normal mood and affect. Her behavior is normal. Judgment and thought content normal.  Nursing note and vitals reviewed.   ED Course  Procedures (including critical care time) Labs Review Labs Reviewed  CBC WITH DIFFERENTIAL/PLATELET - Abnormal; Notable for the following:    RBC 3.52 (*)    Hemoglobin 10.6 (*)    HCT 32.4 (*)    Platelets 402 (*)    Eosinophils Relative 6 (*)    All other components within normal limits  BASIC METABOLIC PANEL - Abnormal; Notable for the following:    Calcium 8.8 (*)    All other components within normal limits    Imaging Review No results found.   EKG Interpretation None      MDM   Final diagnoses:  Muscle tightness  Cough  Viral syndrome    Patient with probable viral syndrome and muscle spasm. Vital signs are stable here. She has no difficulty flexing or moving her neck, there is no meningismus, no headache, highly doubt meningitis. Patient is afebrile, she is well-appearing. I suspect that she may have a viral syndrome. Lungs are clear to auscultation. Will give fluids, and will reassess.  6:58 PM Patient reassessed, she states that she is feeling much better. Again, no evidence of meningitis or meningismus. Suspect viral illness and muscle tightness. Will discharge to home with a muscle relaxer. Patient understands and agrees with the plan. She is stable and ready for discharge.    Roxy Horsemanobert Charlea Nardo, PA-C 03/27/15 1953  Arby BarretteMarcy Pfeiffer, MD 03/28/15 [phone]

## 2015-03-27 NOTE — ED Notes (Addendum)
Pt c/o neck and shoulder pain, R arm pain with fever x 6 days. Temp in triage 98.1, last took ibuprofen at 4am today. Denies headache. Also reports fatigue, vision changes and diaphoresis.

## 2015-03-27 NOTE — Discharge Instructions (Signed)
Muscle Cramps and Spasms Muscle cramps and spasms occur when a muscle or muscles tighten and you have no control over this tightening (involuntary muscle contraction). They are a common problem and can develop in any muscle. The most common place is in the calf muscles of the leg. Both muscle cramps and muscle spasms are involuntary muscle contractions, but they also have differences:   Muscle cramps are sporadic and painful. They may last a few seconds to a quarter of an hour. Muscle cramps are often more forceful and last longer than muscle spasms.  Muscle spasms may or may not be painful. They may also last just a few seconds or much longer. CAUSES  It is uncommon for cramps or spasms to be due to a serious underlying problem. In many cases, the cause of cramps or spasms is unknown. Some common causes are:   Overexertion.   Overuse from repetitive motions (doing the same thing over and over).   Remaining in a certain position for a long period of time.   Improper preparation, form, or technique while performing a sport or activity.   Dehydration.   Injury.   Side effects of some medicines.   Abnormally low levels of the salts and ions in your blood (electrolytes), especially potassium and calcium. This could happen if you are taking water pills (diuretics) or you are pregnant.  Some underlying medical problems can make it more likely to develop cramps or spasms. These include, but are not limited to:   Diabetes.   Parkinson disease.   Hormone disorders, such as thyroid problems.   Alcohol abuse.   Diseases specific to muscles, joints, and bones.   Blood vessel disease where not enough blood is getting to the muscles.  HOME CARE INSTRUCTIONS   Stay well hydrated. Drink enough water and fluids to keep your urine clear or pale yellow.  It may be helpful to massage, stretch, and relax the affected muscle.  For tight or tense muscles, use a warm towel, heating  pad, or hot shower water directed to the affected area.  If you are sore or have pain after a cramp or spasm, applying ice to the affected area may relieve discomfort.  Put ice in a plastic bag.  Place a towel between your skin and the bag.  Leave the ice on for 15-20 minutes, 03-04 times a day.  Medicines used to treat a known cause of cramps or spasms may help reduce their frequency or severity. Only take over-the-counter or prescription medicines as directed by your caregiver. SEEK MEDICAL CARE IF:  Your cramps or spasms get more severe, more frequent, or do not improve over time.  MAKE SURE YOU:   Understand these instructions.  Will watch your condition.  Will get help right away if you are not doing well or get worse. Document Released: 04/09/2002 Document Revised: 02/12/2013 Document Reviewed: 10/04/2012 [facility] Patient Information 2015 [place], [place]. This information is not intended to replace advice given to you by your health care provider. Make sure you discuss any questions you have with your health care provider. Viral Infections A virus is a type of germ. Viruses can cause:  Minor sore throats.  Aches and pains.  Headaches.  Runny nose.  Rashes.  Watery eyes.  Tiredness.  Coughs.  Loss of appetite.  Feeling sick to your stomach (nausea).  Throwing up (vomiting).  Watery poop (diarrhea). HOME CARE   Only take medicines as told by your doctor.  Drink enough water and  fluids to keep your pee (urine) clear or pale yellow. Sports drinks are a good choice.  Get plenty of rest and eat healthy. Soups and broths with crackers or rice are fine. GET HELP RIGHT AWAY IF:   You have a very bad headache.  You have shortness of breath.  You have chest pain or neck pain.  You have an unusual rash.  You cannot stop throwing up.  You have watery poop that does not stop.  You cannot keep fluids down.  You or your child has a temperature by mouth  above 102 F (38.9 C), not controlled by medicine.  Your baby is older than 3 months with a rectal temperature of 102 F (38.9 C) or higher.  Your baby is 413 months old or younger with a rectal temperature of 100.4 F (38 C) or higher. MAKE SURE YOU:   Understand these instructions.  Will watch this condition.  Will get help right away if you are not doing well or get worse. Document Released: 09/30/2008 Document Revised: 01/10/2012 Document Reviewed: 02/23/2011 [facility] Patient Information 2015 [place], [place]. This information is not intended to replace advice given to you by your health care provider. Make sure you discuss any questions you have with your health care provider.

## 2015-04-02 ENCOUNTER — Telehealth: Payer: Self-pay | Admitting: General Practice

## 2015-04-02 ENCOUNTER — Ambulatory Visit: Payer: No Typology Code available for payment source | Attending: Internal Medicine

## 2015-04-02 NOTE — Telephone Encounter (Signed)
Patient presents to clinic requesting a new prescription for Birth Control. Patient states she lost the last prescription that she was given. Please assist.

## 2015-04-03 ENCOUNTER — Ambulatory Visit
Admission: RE | Admit: 2015-04-03 | Discharge: 2015-04-03 | Disposition: A | Discharge status: Home / Self Care | Payer: No Typology Code available for payment source | Source: Ambulatory Visit | Attending: Family Medicine | Admitting: Family Medicine

## 2015-04-03 ENCOUNTER — Other Ambulatory Visit (HOSPITAL_COMMUNITY)
Admission: RE | Admit: 2015-04-03 | Discharge: 2015-04-03 | Disposition: A | Discharge status: Home / Self Care | Payer: No Typology Code available for payment source | Source: Ambulatory Visit | Attending: Diagnostic Radiology | Admitting: Diagnostic Radiology

## 2015-04-03 ENCOUNTER — Other Ambulatory Visit: Payer: Self-pay | Admitting: Family Medicine

## 2015-04-03 DIAGNOSIS — E079 Disorder of thyroid, unspecified: Secondary | ICD-10-CM

## 2015-04-03 DIAGNOSIS — E041 Nontoxic single thyroid nodule: Secondary | ICD-10-CM

## 2015-04-09 ENCOUNTER — Other Ambulatory Visit: Payer: Self-pay | Admitting: Pharmacist

## 2015-04-09 ENCOUNTER — Telehealth: Payer: Self-pay | Admitting: Internal Medicine

## 2015-04-09 ENCOUNTER — Other Ambulatory Visit: Payer: Self-pay | Admitting: Internal Medicine

## 2015-04-09 MED ORDER — NORETHINDRONE ACET-ETHINYL EST 1-20 MG-MCG PO TABS: 1.0000 | ORAL_TABLET | Freq: Every day | ORAL | Status: DC

## 2015-04-09 MED ORDER — NORETHINDRONE-ETH ESTRADIOL 1-5 MG-MCG PO TABS: 1.0000 | ORAL_TABLET | Freq: Every day | ORAL | Status: DC

## 2015-04-09 NOTE — Telephone Encounter (Signed)
Patient has come in today to see if she can request an additional script for Birth Control because she misplaced hers; Patient saw walk-in provider and she is unavailable to consult; please f/u with patient or front desk about what to do

## 2015-04-18 ENCOUNTER — Ambulatory Visit: Payer: No Typology Code available for payment source | Attending: Internal Medicine | Admitting: Internal Medicine

## 2015-04-18 VITALS — BP 104/71 | HR 87 | Wt 199.8 lb

## 2015-04-18 DIAGNOSIS — Z712 Person consulting for explanation of examination or test findings: Secondary | ICD-10-CM

## 2015-04-18 DIAGNOSIS — Z308 Encounter for other contraceptive management: Secondary | ICD-10-CM

## 2015-04-18 DIAGNOSIS — M25561 Pain in right knee: Secondary | ICD-10-CM

## 2015-04-18 DIAGNOSIS — Z7189 Other specified counseling: Secondary | ICD-10-CM

## 2015-04-18 MED ORDER — THERA VITAL M PO TABS: 1.0000 | ORAL_TABLET | Freq: Every day | ORAL | Status: DC

## 2015-04-18 MED ORDER — NORETHINDRONE ACET-ETHINYL EST 1-20 MG-MCG PO TABS: 1.0000 | ORAL_TABLET | Freq: Every day | ORAL | Status: DC

## 2015-04-18 NOTE — Progress Notes (Signed)
Patient ID: Danielle Cohen, female   DOB: December 05, 1975, 39 y.o.   MRN: [id number]  CC: knee pain, refill  HPI: Rheanna Cohen is a 39 y.o. female here today for a follow up visit.  Patient has past medical history of thyroid disease. She was evaluated here last month after having a CT that revealed a thyroid nodule and lung nodule. She was at that time referred for thyroid ultrasound and biopsy. She is requesting results of her biopsy today. Patient is also concerned about previous TB infection. She received treatment for Tuberculosis 5 years ago but never completed the full 6 month cycle because she traveled back to [place]. Last CT scan was in 5/1 which revealed a left lung nodule, recommended 3 month f/u with repeat scan.   She has had right knee pain for 3 months since a fall. Pain described as a locking/stuck sensation. She reports that she only has the pain with ambulation and knee ROM.  She has been taking ibuprofen for pain.   She would like refills of her birth control pills. She has not been out of medication but would like 3 months of pills at one time.   Patient has No headache, No chest pain, No abdominal pain - No Nausea, No new weakness tingling or numbness, No Cough - SOB.  No Known Allergies Past Medical History  Diagnosis Date  . Thyroid disease     told had a thyroid cyst 11/11- needs recheck in 6 months, not done  . Gallstones   . Cholecystitis 04/2012   Current Outpatient Prescriptions on File Prior to Visit  Medication Sig Dispense Refill  . norethindrone-ethinyl estradiol (MICROGESTIN,JUNEL,LOESTRIN) 1-20 MG-MCG tablet Take 1 tablet by mouth daily. 1 Package 3  . cyclobenzaprine (FLEXERIL) 10 MG tablet Take 1 tablet (10 mg total) by mouth 2 (two) times daily as needed for muscle spasms. (Patient not taking: Reported on 04/18/2015) 20 tablet 0  . ibuprofen (ADVIL,MOTRIN) 200 MG tablet Take 600 mg by mouth every 6 (six) hours as needed for mild pain.    . naproxen (NAPROSYN)  500 MG tablet Take 1 tablet (500 mg total) by mouth 2 (two) times daily with a meal. (Patient not taking: Reported on 04/18/2015) 30 tablet 0  . PRESCRIPTION MEDICATION Take 1 tablet by mouth daily. Birth control     No current facility-administered medications on file prior to visit.   Family History  Problem Relation Age of Onset  . Anesthesia problems Neg Hx    History   Social History  . Marital Status: Married    Spouse Name: N/A  . Number of Children: N/A  . Years of Education: N/A   Occupational History  . Not on file.   Social History Main Topics  . Smoking status: Never Smoker   . Smokeless tobacco: Never Used  . Alcohol Use: No  . Drug Use: No  . Sexual Activity: Yes   Other Topics Concern  . Not on file   Social History Narrative    Review of Systems: See HPI.    Objective:   Filed Vitals:   04/18/15 1213  BP: 104/71  Pulse: 87    Physical Exam  Constitutional: She is oriented to person, place, and time.  Cardiovascular: Normal rate, regular rhythm and normal heart sounds.   Pulmonary/Chest: Effort normal and breath sounds normal.  Musculoskeletal: Normal range of motion.  Neurological: She is alert and oriented to person, place, and time.     Lab Results  Component Value Date   WBC 6.1 03/27/2015   HGB 10.6* 03/27/2015   HCT 32.4* 03/27/2015   MCV 92.0 03/27/2015   PLT 402* 03/27/2015   Lab Results  Component Value Date   CREATININE 0.84 03/27/2015   BUN 10 03/27/2015   NA 139 03/27/2015   K 3.8 03/27/2015   CL 108 03/27/2015   CO2 26 03/27/2015    No results found for: HGBA1C Lipid Panel     Component Value Date/Time   CHOL 171 03/11/2015 1146   TRIG 68 03/11/2015 1146   HDL 63 03/11/2015 1146   CHOLHDL 2.7 03/11/2015 1146   VLDL 14 03/11/2015 1146   LDLCALC 94 03/11/2015 1146       Assessment and plan:   Danielle Cohen was seen today for new patient.  Diagnoses and all orders for this visit:  Right knee pain Orders: -      AMB referral to orthopedics Continue ibuprofen and heat.   Encounter to discuss test results Thyroid biopsy was benign. TSH and Free T4 all WNL. Will repeat chest CT in 6 months   Encounter for other contraceptive management Orders: -     Refill norethindrone-ethinyl estradiol (MICROGESTIN,JUNEL,LOESTRIN) 1-20 MG-MCG tablet; Take 1 tablet by mouth daily. -     Refill Multiple Vitamins-Minerals (MULTIVITAMIN) tablet; Take 1 tablet by mouth daily.  Due to language barrier, an interpreter was present during the history-taking and subsequent discussion (and for part of the physical exam) with this patient.  Return if symptoms worsen or fail to improve.       Holland Commons, NP-C [facility] [phone] 04/18/2015, 12:34 PM

## 2015-04-18 NOTE — Patient Instructions (Signed)
Orthopedics will call you for a appointment soon  If you have difficulty breathing, swallowing, or notice that your neck looks bigger go to the ER.

## 2015-04-18 NOTE — Progress Notes (Signed)
  New patient here to established care. Pt would like to discuss her test results and get a referral to orthopedic for her knee.

## 2015-04-21 ENCOUNTER — Encounter: Payer: Self-pay | Admitting: Internal Medicine

## 2015-05-02 ENCOUNTER — Encounter: Payer: Self-pay | Admitting: Family Medicine

## 2015-05-02 ENCOUNTER — Ambulatory Visit (INDEPENDENT_AMBULATORY_CARE_PROVIDER_SITE_OTHER): Payer: No Typology Code available for payment source | Admitting: Family Medicine

## 2015-05-02 VITALS — BP 92/62 | HR 88 | Ht 63.0 in | Wt 199.0 lb

## 2015-05-02 DIAGNOSIS — M25561 Pain in right knee: Secondary | ICD-10-CM

## 2015-05-02 DIAGNOSIS — M2391 Unspecified internal derangement of right knee: Secondary | ICD-10-CM

## 2015-05-02 DIAGNOSIS — M25461 Effusion, right knee: Secondary | ICD-10-CM

## 2015-05-02 NOTE — Patient Instructions (Addendum)
[facility] Tuesday July 12th at [employer]1pm Register on the 2nd Floor [address] [place] [address] [phone]  Make a follow-up to see me on Friday July 15th at the front desk

## 2015-05-02 NOTE — Progress Notes (Signed)
  Danielle Cohen - 39 y.o. female MRN [id number]  Date of birth: 03/14/1976  SUBJECTIVE:     Danielle Cohen is a 39 y.o. female presenting with right knee pain x 3 months. Pain started after a fall going up the stairs where she hit her flexed and slightly rotated inward knee on the corner of the step. She went to the ED to be evaluated after this with negative x-rays. She has had persistent pain since that time, pain is there with only ambulation/standing and is relieved when sitting or laying. She has been unable to fully extend her right knee. She has tried icing multiple times a day for several weeks, ibuprofen 600mg  without relief.   ROS:     +Swelling in right knee, has improved. No numbness/tingling of RLE.  PERTINENT  PMH / PSH FH / / SH:  Past Medical, Surgical, Social, and Family History Reviewed & Updated in the EMR.  Pertinent findings include:  No prior history of injury, no prior surgeries  OBJECTIVE: BP 92/62 mmHg  Pulse 88  Ht 5\' 3"  (1.6 m)  Wt 199 lb (90.266 kg)  BMI 35.26 kg/m2  Physical Exam:  Vital signs are reviewed.  General: NAD MSK: Right knee:  Inspection: normal appearance Palpation: TTP lateral joint line. Effusion noted.  ROM: flexion is normal. Unable to fully extend knee due to pain, extends up to last 10 degrees Provocative testing: McMurray's test with consistent palpable clicking, pain prior to full extension.   X-ray reviewed, normal  Right knee ultrasound:   ASSESSMENT & PLAN:  See problem based charting & AVS for pt instructions.

## 2015-05-02 NOTE — Assessment & Plan Note (Signed)
Persistent knee pain after trauma. Exam is concerning for palpable clicks/positive McMurray and inability to fully extend the knee. X-rays from the ED were reviewed and were unremarkable. Ultrasound done today shows what appears to be degenerated patellar tendon, changes to lateral meniscus although could not get a great view of this. With her pain being unresolved at this point will need to get an MRI to further characterize and evaluate, suspicious for meniscal tears. F/u after MRI.

## 2015-05-06 NOTE — Progress Notes (Signed)
Patient ID: Danielle Cohen, female   DOB: 12/03/1975, 39 y.o.   MRN: [id number] [facility] Attending Note: I have seen and examined this patient. I have discussed this patient with the resident and reviewed the assessment and plan as documented above. I agree with the resident's findings and plan.  PERTINENT  PMH / PSH: I have reviewed the patient's medications, allergies, past medical and surgical history. Pertinent findings that relate to today's visit / issues include: No personal history of diabetes mellitus. She does have history of thyroid disease.  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 Left knee has medial compartment narrowing.  US: Moderate sized suprapatellar pouch effusion. The quadricep tendon looks somewhat abnormal in consistency. I wonder if it is partially fatty replaced or amyloid replaced. I cannot rule out a partial tear. Patellar tendon appears intact. The lateral and medial menisci are seen and appear mildly irregular with some fluid around the medial meniscus.  ASSESSMENT/plan: 3 months of knee pain now with mechanical symptoms. I have 2 concerns, meniscal pathology and / or partial quadriceps tendon tear. I think the only way to evaluate this further his MRI which we have set up.

## 2015-05-13 ENCOUNTER — Ambulatory Visit (HOSPITAL_COMMUNITY)
Admission: RE | Admit: 2015-05-13 | Discharge: 2015-05-13 | Disposition: A | Discharge status: Home / Self Care | Payer: No Typology Code available for payment source | Source: Ambulatory Visit | Attending: Family Medicine | Admitting: Family Medicine

## 2015-05-13 ENCOUNTER — Encounter: Payer: Self-pay | Admitting: Family Medicine

## 2015-05-13 DIAGNOSIS — M25561 Pain in right knee: Secondary | ICD-10-CM

## 2015-05-13 DIAGNOSIS — R936 Abnormal findings on diagnostic imaging of limbs: Secondary | ICD-10-CM | POA: Insufficient documentation

## 2015-05-13 DIAGNOSIS — W19XXXA Unspecified fall, initial encounter: Secondary | ICD-10-CM | POA: Insufficient documentation

## 2015-05-13 DIAGNOSIS — M25461 Effusion, right knee: Secondary | ICD-10-CM | POA: Insufficient documentation

## 2015-05-16 ENCOUNTER — Ambulatory Visit (INDEPENDENT_AMBULATORY_CARE_PROVIDER_SITE_OTHER): Payer: Self-pay | Admitting: Family Medicine

## 2015-05-16 ENCOUNTER — Encounter: Payer: Self-pay | Admitting: Family Medicine

## 2015-05-16 VITALS — BP 104/70 | HR 93 | Ht 63.0 in | Wt 201.0 lb

## 2015-05-16 DIAGNOSIS — M25561 Pain in right knee: Secondary | ICD-10-CM

## 2015-05-16 DIAGNOSIS — S83281A Other tear of lateral meniscus, current injury, right knee, initial encounter: Secondary | ICD-10-CM

## 2015-05-16 MED ORDER — METHYLPREDNISOLONE ACETATE 40 MG/ML IJ SUSP
40.0000 mg | Freq: Once | INTRAMUSCULAR | Status: AC
  Administered 2015-05-16: 40 mg via INTRA_ARTICULAR

## 2015-05-16 NOTE — Progress Notes (Signed)
Patient ID: Danielle Cohen, female   DOB: 05/11/1976, 39 y.o.   MRN: [id number]   Interpreter for visit is Virgel BouquetJameel Ali

## 2015-05-16 NOTE — Patient Instructions (Signed)
Your knee MRI shows fairly severe arthritis--this has been present a long time. It also shows a probable meniscal tear--this probably occurred 3 months ago when you injured your knee. Today we are doing an injection with steroid to see if we can improve your pain. I would like to see you back in 2-4 weeks for follow up. Hopefully this will help your pain from the meniscal tear. If not, we may need to consider arthroscopy.   For long term planning, your knee has pretty advanced arthritis--more than I would expect for your age. This MAY mean you will be looking at possible knee replacement in 10 years or so. You are too young to consider it now and I mention this only for your information.

## 2015-05-20 DIAGNOSIS — S83289A Other tear of lateral meniscus, current injury, unspecified knee, initial encounter: Secondary | ICD-10-CM | POA: Insufficient documentation

## 2015-05-20 HISTORY — DX: Other tear of lateral meniscus, current injury, unspecified knee, initial encounter: S83.289A

## 2015-05-20 NOTE — Progress Notes (Signed)
   Subjective:    Patient ID: Danielle Cohen, female    DOB: 09/07/1976, 39 y.o.   MRN: [id number]  HPI  F/u Right kneepain Had MRI Symptoms no better No new symptoms  Review of Systems Pertinent review of systems: negative for fever or unusual weight change.     Objective:   Physical Exam  VSS WDWN NAD Right knee: lacks ful extension seconday to pain but passively I cangetit fully extended.She cannot fully flex and willnot let me passively moveit beyond 100 degrees.  1. Tricompartmental cartilage abnormalities as described above, most severe in the lateral patellofemoral compartment. 2. Increased signal in the anterior horn of the lateral meniscus with a possible tear along the superior articular surface centrally I reviewed imnages with her and explained via interpretor  INJECTION: Patient was given informed consent, signed copy in the chart. Appropriate time out was taken. Area prepped and draped in usual sterile fashion. 1 cc of methylprednisolone 40 mg/ml plus  4 cc of 1% lidocaine without epinephrine was injected into the right knee using a(n) anterior medial approach. The patient tolerated the procedure well. There were no complications. Post procedure instructions were given.       Assessment & Plan:  Reviewed MRI Fairly significant underlyinh OA (esp for age) with apparently new lateral mensical tear. Will try CSI today and f/u 1 m We discussed findings of severe OAandlong term sequelae incl likely need for TKR at some point in future, Greater than 50% of our 45 minute office visit was spent in counseling and education regarding these issues.

## 2015-05-30 ENCOUNTER — Encounter: Payer: Self-pay | Admitting: Family Medicine

## 2015-05-30 ENCOUNTER — Ambulatory Visit (INDEPENDENT_AMBULATORY_CARE_PROVIDER_SITE_OTHER): Payer: Self-pay | Admitting: Family Medicine

## 2015-05-30 VITALS — BP 101/71 | Ht 65.0 in | Wt 201.0 lb

## 2015-05-30 DIAGNOSIS — S83281D Other tear of lateral meniscus, current injury, right knee, subsequent encounter: Secondary | ICD-10-CM

## 2015-05-30 MED ORDER — METHYLPREDNISOLONE ACETATE 40 MG/ML IJ SUSP
40.0000 mg | Freq: Once | INTRAMUSCULAR | Status: AC
  Administered 2015-05-30: 40 mg via INTRA_ARTICULAR

## 2015-06-01 NOTE — Assessment & Plan Note (Signed)
Long discussion. I agreed to do second CSI but would not recommend another for three months, IF she needs one then.

## 2015-06-01 NOTE — Progress Notes (Signed)
   Subjective:    Patient ID: Danielle Cohen, female    DOB: 1975-11-27, 39 y.o.   MRN: [id number]  HPI  F/u right knee pain CSI at last ov 50 % better. Wants to consider second injection.Feelslike she is walking better, more able to fully extend he rknee.  Review of Systems No swelling of knee,no new symptoms    Objective:   Physical Exam WD WN NAD KNEE: RIGHT: mild crepitus on extension but she now has FROM in flexion andextension. Painful patellar grind test. Ligamentously intact to varus and valgus stress. Popliteal space is benign and calf is soft.  INJECTION: Patient was given informed consent, signed copy in the chart. Appropriate time out was taken. Area prepped and draped in usual sterile fashion. 1 cc of methylprednisolone 40 mg/ml plus  4 cc of 1% lidocaine without epinephrine was injected into the rigt knee using a(n) anterior medial approach. The patient tolerated the procedure well. There were no complications. Post procedure instructions were given.        Assessment & Plan:

## 2015-06-20 NOTE — Telephone Encounter (Signed)
Patient called in asking for a referral to see physical therapy.  Patient is currently seeing sports medicine and they should manage her PT needs.

## 2015-07-29 ENCOUNTER — Emergency Department (HOSPITAL_COMMUNITY)
Admission: EM | Admit: 2015-07-29 | Discharge: 2015-07-29 | Disposition: A | Discharge status: Home / Self Care | Payer: No Typology Code available for payment source | Attending: Emergency Medicine | Admitting: Emergency Medicine

## 2015-07-29 ENCOUNTER — Emergency Department (HOSPITAL_COMMUNITY): Payer: No Typology Code available for payment source

## 2015-07-29 ENCOUNTER — Encounter (HOSPITAL_COMMUNITY): Payer: Self-pay | Admitting: Emergency Medicine

## 2015-07-29 DIAGNOSIS — R059 Cough, unspecified: Secondary | ICD-10-CM

## 2015-07-29 DIAGNOSIS — J029 Acute pharyngitis, unspecified: Secondary | ICD-10-CM | POA: Insufficient documentation

## 2015-07-29 DIAGNOSIS — Z3202 Encounter for pregnancy test, result negative: Secondary | ICD-10-CM | POA: Insufficient documentation

## 2015-07-29 DIAGNOSIS — R062 Wheezing: Secondary | ICD-10-CM | POA: Insufficient documentation

## 2015-07-29 DIAGNOSIS — R0602 Shortness of breath: Secondary | ICD-10-CM | POA: Insufficient documentation

## 2015-07-29 DIAGNOSIS — R05 Cough: Secondary | ICD-10-CM

## 2015-07-29 DIAGNOSIS — Z8639 Personal history of other endocrine, nutritional and metabolic disease: Secondary | ICD-10-CM | POA: Insufficient documentation

## 2015-07-29 LAB — CBC WITH DIFFERENTIAL/PLATELET
Basophils Absolute: 0 10*3/uL (ref 0.0–0.1)
Basophils Relative: 1 %
EOS ABS: 0.4 10*3/uL (ref 0.0–0.7)
Eosinophils Relative: 7 %
HCT: 38.7 % (ref 36.0–46.0)
Hemoglobin: 13 g/dL (ref 12.0–15.0)
Lymphocytes Relative: 34 %
Lymphs Abs: 2.2 10*3/uL (ref 0.7–4.0)
MCH: 31.1 pg (ref 26.0–34.0)
MCHC: 33.6 g/dL (ref 30.0–36.0)
MCV: 92.6 fL (ref 78.0–100.0)
MONOS PCT: 9 %
Monocytes Absolute: 0.6 10*3/uL (ref 0.1–1.0)
NEUTROS PCT: 49 %
Neutro Abs: 3.2 10*3/uL (ref 1.7–7.7)
Platelets: 433 10*3/uL — ABNORMAL HIGH (ref 150–400)
RBC: 4.18 MIL/uL (ref 3.87–5.11)
RDW: 13 % (ref 11.5–15.5)
WBC: 6.4 10*3/uL (ref 4.0–10.5)

## 2015-07-29 LAB — LIPASE, BLOOD: Lipase: 27 U/L (ref 22–51)

## 2015-07-29 LAB — URINE MICROSCOPIC-ADD ON

## 2015-07-29 LAB — URINALYSIS, ROUTINE W REFLEX MICROSCOPIC
Bilirubin Urine: NEGATIVE
Glucose, UA: NEGATIVE mg/dL
KETONES UR: NEGATIVE mg/dL
Nitrite: NEGATIVE
PROTEIN: NEGATIVE mg/dL
Specific Gravity, Urine: 1.024 (ref 1.005–1.030)
UROBILINOGEN UA: 0.2 mg/dL (ref 0.0–1.0)
pH: 5.5 (ref 5.0–8.0)

## 2015-07-29 LAB — COMPREHENSIVE METABOLIC PANEL
ALK PHOS: 44 U/L (ref 38–126)
ALT: 10 U/L — AB (ref 14–54)
AST: 31 U/L (ref 15–41)
Albumin: 3.2 g/dL — ABNORMAL LOW (ref 3.5–5.0)
Anion gap: 11 (ref 5–15)
BUN: 11 mg/dL (ref 6–20)
CALCIUM: 9 mg/dL (ref 8.9–10.3)
CHLORIDE: 104 mmol/L (ref 101–111)
CO2: 21 mmol/L — AB (ref 22–32)
Creatinine, Ser: 0.62 mg/dL (ref 0.44–1.00)
GFR calc non Af Amer: >60 mL/min (ref >60)
Glucose, Bld: 89 mg/dL (ref 65–99)
Potassium: 5.7 mmol/L — ABNORMAL HIGH (ref 3.5–5.1)
SODIUM: 136 mmol/L (ref 135–145)
Total Bilirubin: 1.1 mg/dL (ref 0.3–1.2)
Total Protein: 6.8 g/dL (ref 6.5–8.1)

## 2015-07-29 LAB — I-STAT BETA HCG BLOOD, ED (MC, WL, AP ONLY)

## 2015-07-29 LAB — POTASSIUM
Potassium: 5.7 mmol/L — ABNORMAL HIGH (ref 3.5–5.1)
Potassium: 3.4 mmol/L — ABNORMAL LOW (ref 3.5–5.1)

## 2015-07-29 LAB — CK TOTAL AND CKMB (NOT AT ARMC)
CK, MB: 1.1 ng/mL (ref 0.5–5.0)
Relative Index: INVALID (ref 0.0–2.5)
Total CK: 86 U/L (ref 38–234)

## 2015-07-29 LAB — POC URINE PREG, ED: PREG TEST UR: NEGATIVE

## 2015-07-29 MED ORDER — ALBUTEROL SULFATE HFA 108 (90 BASE) MCG/ACT IN AERS: 2.0000 | INHALATION_SPRAY | RESPIRATORY_TRACT | Status: DC | PRN

## 2015-07-29 MED ORDER — SODIUM CHLORIDE 0.9 % IV BOLUS (SEPSIS)
1000.0000 mL | Freq: Once | INTRAVENOUS | Status: AC
  Administered 2015-07-29: 1000 mL via INTRAVENOUS

## 2015-07-29 MED ORDER — IPRATROPIUM BROMIDE 0.02 % IN SOLN
0.5000 mg | Freq: Once | RESPIRATORY_TRACT | Status: AC
  Administered 2015-07-29: 0.5 mg via RESPIRATORY_TRACT
  Filled 2015-07-29: qty 2.5

## 2015-07-29 MED ORDER — ALBUTEROL (5 MG/ML) CONTINUOUS INHALATION SOLN
10.0000 mg/h | INHALATION_SOLUTION | Freq: Once | RESPIRATORY_TRACT | Status: AC
  Administered 2015-07-29: 10 mg/h via RESPIRATORY_TRACT
  Filled 2015-07-29: qty 20

## 2015-07-29 MED ORDER — METHYLPREDNISOLONE SODIUM SUCC 125 MG IJ SOLR
125.0000 mg | Freq: Once | INTRAMUSCULAR | Status: AC
  Administered 2015-07-29: 125 mg via INTRAVENOUS
  Filled 2015-07-29: qty 2

## 2015-07-29 MED ORDER — AZITHROMYCIN 250 MG PO TABS: 250.0000 mg | ORAL_TABLET | Freq: Every day | ORAL | Status: DC

## 2015-07-29 MED ORDER — DEXAMETHASONE SODIUM PHOSPHATE 10 MG/ML IJ SOLN
10.0000 mg | Freq: Once | INTRAMUSCULAR | Status: AC
  Administered 2015-07-29: 10 mg via INTRAVENOUS
  Filled 2015-07-29: qty 1

## 2015-07-29 NOTE — ED Provider Notes (Signed)
CSN: [id number]     Arrival date & time 07/29/15  1244 History   First MD Initiated Contact with Patient 07/29/15 1615     Chief Complaint  Patient presents with  . Cough  . Fall     (Consider location/radiation/quality/duration/timing/severity/associated sxs/prior Treatment) Patient is a 39 y.o. female presenting with cough and fall. The history is provided by the patient.  Cough Cough characteristics:  Productive Sputum characteristics:  Clear Severity:  Moderate Onset quality:  Gradual Duration:  3 weeks Timing: constant, and worse at night. Progression:  Worsening Smoker: no   Relieved by:  Cough suppressants, decongestant and fluids Worsened by:  Nothing tried Ineffective treatments:  Cough suppressants, decongestant and fluids Associated symptoms: rhinorrhea, shortness of breath, sinus congestion, sore throat and wheezing   Associated symptoms: no chills, no ear fullness, no ear pain, no eye discharge and no fever   Associated symptoms comment:  Abdominal pain, back pain, and chest pain with coughing Fall Associated symptoms include coughing and a sore throat. Pertinent negatives include no chills, fever, nausea or vomiting.    Past Medical History  Diagnosis Date  . Thyroid disease     told had a thyroid cyst 11/11- needs recheck in 6 months, not done  . Gallstones   . Cholecystitis 04/2012   Past Surgical History  Procedure Laterality Date  . Appendectomy      in [place]  . Vaginal births      6 births- 1 set of twins 59yrs- 6months  . Cholecystectomy  04/06/2012    Procedure: LAPAROSCOPIC CHOLECYSTECTOMY WITH INTRAOPERATIVE CHOLANGIOGRAM;  Surgeon: Atilano Ina, MD,FACS;  Location: [facility] OR;  Service: General;  Laterality: N/A;   Family History  Problem Relation Age of Onset  . Anesthesia problems Neg Hx    Social History  Substance Use Topics  . Smoking status: Never Smoker   . Smokeless tobacco: Never Used  . Alcohol Use: No   OB History    Gravida Para  Term Preterm AB TAB SAB Ectopic Multiple Living   0 0 0 0 0 1 6     Review of Systems  Constitutional: Negative for fever and chills.  HENT: Positive for rhinorrhea and sore throat. Negative for ear pain.   Eyes: Negative for discharge.  Respiratory: Positive for cough, shortness of breath and wheezing.   Gastrointestinal: Negative for nausea, vomiting, diarrhea and blood in stool.  Genitourinary: Negative for dysuria, urgency and hematuria.      Allergies  Review of patient's allergies indicates no known allergies.  Home Medications   Prior to Admission medications   Not on File   BP 111/65 mmHg  Pulse 111  Temp(Src) 98.8 F (37.1 C) (Oral)  Resp 16  SpO2 97%  LMP 06/16/2015 Physical Exam  Constitutional: She is oriented to person, place, and time. She appears well-developed and well-nourished.  HENT:  Head: Normocephalic and atraumatic.  Mouth/Throat: Oropharynx is clear and moist.  Eyes: Pupils are equal, round, and reactive to light.  Neck: Normal range of motion. Neck supple.  Cardiovascular: Normal rate, regular rhythm, normal heart sounds and intact distal pulses.   No murmur heard. Pulmonary/Chest: Effort normal. No accessory muscle usage. No respiratory distress. She has wheezes. She has rales.  Abdominal: Soft. Bowel sounds are normal. She exhibits no distension. There is no tenderness.    Musculoskeletal: Normal range of motion.  Lymphadenopathy:    She has no cervical adenopathy.  Neurological: She is alert and oriented to  person, place, and time.  Skin: Skin is warm and dry.  Psychiatric: She has a normal mood and affect. Her behavior is normal.    ED Course  Procedures (including critical care time) Labs Review Labs Reviewed  URINALYSIS, ROUTINE W REFLEX MICROSCOPIC (NOT AT [facility]) - Abnormal; Notable for the following:    APPearance CLOUDY (*)    Hgb urine dipstick MODERATE (*)    Leukocytes, UA SMALL (*)    All other components within  normal limits  URINE MICROSCOPIC-ADD ON - Abnormal; Notable for the following:    Squamous Epithelial / LPF MANY (*)    Bacteria, UA MANY (*)    All other components within normal limits  COMPREHENSIVE METABOLIC PANEL - Abnormal; Notable for the following:    Potassium 5.7 (*)    CO2 21 (*)    Albumin 3.2 (*)    ALT 10 (*)    All other components within normal limits  CBC WITH DIFFERENTIAL/PLATELET - Abnormal; Notable for the following:    Platelets 433 (*)    All other components within normal limits  POTASSIUM - Abnormal; Notable for the following:    Potassium 5.7 (*)    All other components within normal limits  LIPASE, BLOOD  CK TOTAL AND CKMB (NOT AT [facility])  POTASSIUM  POC URINE PREG, ED  I-STAT BETA HCG BLOOD, ED ([facility], [facility], [facility] ONLY)    Imaging Review Dg Chest 2 View  07/29/2015   CLINICAL DATA:  Cough for 3 weeks.  EXAM: CHEST  2 VIEW  COMPARISON:  Mar 02, 2015.  FINDINGS: The heart size and mediastinal contours are within normal limits. Both lungs are clear. No pneumothorax or pleural effusion is noted. The visualized skeletal structures are unremarkable.  IMPRESSION: No active cardiopulmonary disease.   Electronically Signed   By: Lupita Raider, M.D.   On: 07/29/2015 15:18   I have personally reviewed and evaluated these images and lab results as part of my medical decision-making.   EKG Interpretation   Date/Time:  Tuesday July 29 2015 19:18:54 EDT Ventricular Rate:  113 PR Interval:  148 QRS Duration: 80 QT Interval:  334 QTC Calculation: 458 R Axis:   30 Text Interpretation:  Sinus tachycardia Posterior infarct, old No  significant change since last tracing Confirmed by FLOYD MD, DANIEL  ([id number]) on 07/29/2015 7:22:23 PM      MDM   Final diagnoses:  Cough    Pt presents with gradual worsening persistent cough x 3 weeks with congestion.  VSS, 97% on RA, NAD, non-toxic.  On exam, wheezing and crackles throughout lung fields.  Abdomen is soft and  nontender.  Small nonpainful hernia noted in RLQ.  Will start IVF, CAT, solumedrol, atrovent, and decadron.  Will reassess after breathing treatment.   -K 5.7, will recheck -CBC, lipase, CMP unremarkable (except K) -CXR shows no evidence of cardiopulmonary disease.  -repeat K, 3.7  Upon reassessment after CAT, pt sxs improved.  On exam, decreased wheezing and crackles.  Continued 97% on RA. Pt stable for d/c.  Discussed return precautions.  Patient agrees and acknowledges this plan.  Patient is stable for discharge.  Tachycardia secondary to CAT. Concern for pertussis vs bronchitis.  Will d/c home with z-pack.  She has a preexisting appointment with PCP in 2 days, she will follow up then.       Cheri Fowler, PA-C 07/29/15 1957  Melene Plan, DO 07/29/15 [phone]

## 2015-07-29 NOTE — ED Notes (Signed)
Pt sts cough x 3 weeks and fall 2 weeks ago; pt sts lower abd pain with cough; pt sts LMP was 06/16/15

## 2015-07-29 NOTE — Discharge Instructions (Signed)
Cough, Adult  A cough is a reflex that helps clear your throat and airways. It can help heal the body or may be a reaction to an irritated airway. A cough may only last 2 or 3 weeks (acute) or may last more than 8 weeks (chronic).  CAUSES Acute cough:  Viral or bacterial infections. Chronic cough:  Infections.  Allergies.  Asthma.  Post-nasal drip.  Smoking.  Heartburn or acid reflux.  Some medicines.  Chronic lung problems (COPD).  Cancer. SYMPTOMS   Cough.  Fever.  Chest pain.  Increased breathing rate.  High-pitched whistling sound when breathing (wheezing).  Colored mucus that you cough up (sputum). TREATMENT   A bacterial cough may be treated with antibiotic medicine.  A viral cough must run its course and will not respond to antibiotics.  Your caregiver may recommend other treatments if you have a chronic cough. HOME CARE INSTRUCTIONS   Only take over-the-counter or prescription medicines for pain, discomfort, or fever as directed by your caregiver. Use cough suppressants only as directed by your caregiver.  Use a cold steam vaporizer or humidifier in your bedroom or home to help loosen secretions.  Sleep in a semi-upright position if your cough is worse at night.  Rest as needed.  Stop smoking if you smoke. SEEK IMMEDIATE MEDICAL CARE IF:   You have pus in your sputum.  Your cough starts to worsen.  You cannot control your cough with suppressants and are losing sleep.  You begin coughing up blood.  You have difficulty breathing.  You develop pain which is getting worse or is uncontrolled with medicine.  You have a fever. MAKE SURE YOU:   Understand these instructions.  Will watch your condition.  Will get help right away if you are not doing well or get worse. Document Released: 04/16/2011 Document Revised: 01/10/2012 Document Reviewed: 04/16/2011 ExitCare Patient Information 2015 [employer]. This information is not intended  to replace advice given to you by your health care provider. Make sure you discuss any questions you have with your health care provider.  

## 2015-07-29 NOTE — ED Notes (Signed)
Called respiratory about CAT

## 2015-07-31 ENCOUNTER — Ambulatory Visit: Payer: No Typology Code available for payment source | Attending: Internal Medicine | Admitting: Internal Medicine

## 2015-07-31 ENCOUNTER — Encounter: Payer: Self-pay | Admitting: Internal Medicine

## 2015-07-31 VITALS — BP 109/66 | HR 91 | Temp 98.0°F | Resp 16 | Ht 65.0 in | Wt 208.6 lb

## 2015-07-31 DIAGNOSIS — R05 Cough: Secondary | ICD-10-CM

## 2015-07-31 DIAGNOSIS — R059 Cough, unspecified: Secondary | ICD-10-CM

## 2015-07-31 DIAGNOSIS — N926 Irregular menstruation, unspecified: Secondary | ICD-10-CM

## 2015-07-31 LAB — POCT URINE PREGNANCY: Preg Test, Ur: NEGATIVE

## 2015-07-31 MED ORDER — BENZONATATE 200 MG PO CAPS: 200.0000 mg | ORAL_CAPSULE | Freq: Two times a day (BID) | ORAL | Status: DC | PRN

## 2015-07-31 MED ORDER — LORATADINE 10 MG PO TABS: 10.0000 mg | ORAL_TABLET | Freq: Every day | ORAL | Status: DC

## 2015-07-31 MED ORDER — NORETHINDRONE ACET-ETHINYL EST 1-20 MG-MCG PO TABS: 1.0000 | ORAL_TABLET | Freq: Every day | ORAL | Status: DC

## 2015-07-31 NOTE — Patient Instructions (Signed)
Continue zithromax and begin Claritin daily  I have refilled your birth control pills as well.

## 2015-07-31 NOTE — Progress Notes (Signed)
Patient ID: Danielle Cohen, female   DOB: Mar 26, 1976, 39 y.o.   MRN: [id number]  CC: cough, med refill   HPI: Danielle Cohen is a 39 y.o. female here today for a follow up visit.  Patient has past medical history of thyroid disease. Patient reports that she went to the ER on 9/27. Patient reports that she was discharged home with a zithromax and inhalers. She reports that she just started z-pack last night but still has cough. The cough has been present for 3 weeks. She denies sore throat, fevers, chills, rhinitis, headache.  Patient also reports that she has not had a menstrual cycle since August 15th. She notes that she has been taking her OCP as prescribed without any missed doses. She took her last pill last night and would like a refill so that she may continue medication today. She reports that she had a pregnancy test in the ER two days ago that was negative.    No Known Allergies Past Medical History  Diagnosis Date  . Thyroid disease     told had a thyroid cyst 11/11- needs recheck in 6 months, not done  . Gallstones   . Cholecystitis 04/2012   Current Outpatient Prescriptions on File Prior to Visit  Medication Sig Dispense Refill  . albuterol (PROVENTIL HFA;VENTOLIN HFA) 108 (90 BASE) MCG/ACT inhaler Inhale 2 puffs into the lungs every 4 (four) hours as needed for wheezing or shortness of breath. 1 Inhaler 0  . azithromycin (ZITHROMAX) 250 MG tablet Take 1 tablet (250 mg total) by mouth daily. Take first 2 tablets together, then 1 every day until finished. 6 tablet 0   No current facility-administered medications on file prior to visit.   Family History  Problem Relation Age of Onset  . Anesthesia problems Neg Hx    Social History   Social History  . Marital Status: Married    Spouse Name: N/A  . Number of Children: N/A  . Years of Education: N/A   Occupational History  . Not on file.   Social History Main Topics  . Smoking status: Never Smoker   . Smokeless tobacco:  Never Used  . Alcohol Use: No  . Drug Use: No  . Sexual Activity: Yes   Other Topics Concern  . Not on file   Social History Narrative    Review of Systems: Other than what is stated in HPI, all other systems are negative.   Objective:   Filed Vitals:   07/31/15 1412  BP: 109/66  Pulse: 91  Temp: 98 F (36.7 C)  Resp: 16    Physical Exam  Constitutional: She is oriented to person, place, and time.  HENT:  Right Ear: External ear normal.  Left Ear: External ear normal.  Mouth/Throat: Oropharynx is clear and moist.  Eyes: Right eye exhibits no discharge. Left eye exhibits no discharge.  Cardiovascular: Normal rate, regular rhythm and normal heart sounds.   Pulmonary/Chest: Effort normal and breath sounds normal. She has no wheezes.  Lymphadenopathy:    She has no cervical adenopathy.  Neurological: She is alert and oriented to person, place, and time.  Skin: Skin is warm and dry.  Psychiatric: She has a normal mood and affect.    Lab Results  Component Value Date   WBC 6.4 07/29/2015   HGB 13.0 07/29/2015   HCT 38.7 07/29/2015   MCV 92.6 07/29/2015   PLT 433* 07/29/2015   Lab Results  Component Value Date   CREATININE 0.62 07/29/2015  BUN 11 07/29/2015   NA 136 07/29/2015   K 3.4* 07/29/2015   CL 104 07/29/2015   CO2 21* 07/29/2015    No results found for: HGBA1C Lipid Panel     Component Value Date/Time   CHOL 171 03/11/2015 1146   TRIG 68 03/11/2015 1146   HDL 63 03/11/2015 1146   CHOLHDL 2.7 03/11/2015 1146   VLDL 14 03/11/2015 1146   LDLCALC 94 03/11/2015 1146       Assessment and plan:   Danielle Cohen was seen today for cough.  Diagnoses and all orders for this visit:  Cough -    Begin loratadine (CLARITIN) 10 MG tablet; Take 1 tablet (10 mg total) by mouth daily. -     Begin benzonatate (TESSALON) 200 MG capsule; Take 1 capsule (200 mg total) by mouth 2 (two) times daily as needed for cough. Likely allergy related  Missed period -     Refill norethindrone-ethinyl estradiol (MICROGESTIN,JUNEL,LOESTRIN) 1-20 MG-MCG tablet; Take 1 tablet by mouth daily. -     POCT urine pregnancy--negative May continue OCP today so that she does not have any missed doses  Return if symptoms worsen or fail to improve.         Ambrose Finland, NP-C [facility] [phone] 07/31/2015, 2:19 PM

## 2015-07-31 NOTE — Progress Notes (Signed)
Patient here for follow up from the ED Was seen on 9/27 for cough and congestion Patient also stated she has not had a period since 8/15 so a urine preg was Done in the ED and well as a blood draw Per patient both were negative Patient states she has been coughing for the past three weeks

## 2015-11-18 ENCOUNTER — Emergency Department (HOSPITAL_COMMUNITY): Payer: Self-pay

## 2015-11-18 ENCOUNTER — Encounter (HOSPITAL_COMMUNITY): Payer: Self-pay

## 2015-11-18 DIAGNOSIS — E041 Nontoxic single thyroid nodule: Secondary | ICD-10-CM | POA: Insufficient documentation

## 2015-11-18 DIAGNOSIS — R0789 Other chest pain: Secondary | ICD-10-CM | POA: Insufficient documentation

## 2015-11-18 DIAGNOSIS — Z79899 Other long term (current) drug therapy: Secondary | ICD-10-CM | POA: Insufficient documentation

## 2015-11-18 DIAGNOSIS — Z8719 Personal history of other diseases of the digestive system: Secondary | ICD-10-CM | POA: Insufficient documentation

## 2015-11-18 DIAGNOSIS — Z792 Long term (current) use of antibiotics: Secondary | ICD-10-CM | POA: Insufficient documentation

## 2015-11-18 DIAGNOSIS — Z793 Long term (current) use of hormonal contraceptives: Secondary | ICD-10-CM | POA: Insufficient documentation

## 2015-11-18 LAB — CBC
HCT: 34.9 % — ABNORMAL LOW (ref 36.0–46.0)
Hemoglobin: 11.7 g/dL — ABNORMAL LOW (ref 12.0–15.0)
MCH: 31.2 pg (ref 26.0–34.0)
MCHC: 33.5 g/dL (ref 30.0–36.0)
MCV: 93.1 fL (ref 78.0–100.0)
PLATELETS: 415 10*3/uL — AB (ref 150–400)
RBC: 3.75 MIL/uL — ABNORMAL LOW (ref 3.87–5.11)
RDW: 12.9 % (ref 11.5–15.5)
WBC: 7.7 10*3/uL (ref 4.0–10.5)

## 2015-11-18 LAB — BASIC METABOLIC PANEL
ANION GAP: 10 (ref 5–15)
BUN: 8 mg/dL (ref 6–20)
CHLORIDE: 106 mmol/L (ref 101–111)
CO2: 23 mmol/L (ref 22–32)
Calcium: 9.1 mg/dL (ref 8.9–10.3)
Creatinine, Ser: 0.63 mg/dL (ref 0.44–1.00)
GFR calc non Af Amer: >60 mL/min (ref >60)
Glucose, Bld: 111 mg/dL — ABNORMAL HIGH (ref 65–99)
POTASSIUM: 3.6 mmol/L (ref 3.5–5.1)
SODIUM: 139 mmol/L (ref 135–145)

## 2015-11-18 LAB — I-STAT TROPONIN, ED: TROPONIN I, POC: 0 ng/mL (ref 0.00–0.08)

## 2015-11-18 NOTE — ED Notes (Signed)
Pt states she started feeling SOB and having cp 3 days ago. The SOB is worse at night when she tries to lie down and in the morning.

## 2015-11-19 ENCOUNTER — Emergency Department (HOSPITAL_COMMUNITY): Payer: No Typology Code available for payment source

## 2015-11-19 ENCOUNTER — Emergency Department (HOSPITAL_COMMUNITY)
Admission: EM | Admit: 2015-11-19 | Discharge: 2015-11-19 | Disposition: A | Discharge status: Home / Self Care | Payer: Self-pay | Attending: Emergency Medicine | Admitting: Emergency Medicine

## 2015-11-19 ENCOUNTER — Encounter (HOSPITAL_COMMUNITY): Payer: Self-pay | Admitting: Radiology

## 2015-11-19 DIAGNOSIS — R0789 Other chest pain: Secondary | ICD-10-CM

## 2015-11-19 DIAGNOSIS — E041 Nontoxic single thyroid nodule: Secondary | ICD-10-CM

## 2015-11-19 LAB — D-DIMER, QUANTITATIVE: D-Dimer, Quant: 0.63 ug/mL-FEU — ABNORMAL HIGH (ref 0.00–0.50)

## 2015-11-19 MED ORDER — IOHEXOL 350 MG/ML SOLN
100.0000 mL | Freq: Once | INTRAVENOUS | Status: AC | PRN
  Administered 2015-11-19: 100 mL via INTRAVENOUS

## 2015-11-19 MED ORDER — IBUPROFEN 400 MG PO TABS
600.0000 mg | ORAL_TABLET | Freq: Once | ORAL | Status: AC
  Administered 2015-11-19: 600 mg via ORAL
  Filled 2015-11-19: qty 1

## 2015-11-19 MED ORDER — IBUPROFEN 600 MG PO TABS: 600.0000 mg | ORAL_TABLET | Freq: Four times a day (QID) | ORAL | Status: DC | PRN

## 2015-11-19 NOTE — ED Provider Notes (Signed)
CSN: [id number]     Arrival date & time 11/18/15  2200 History  By signing my name below, I, Danielle Cohen, attest that this documentation has been prepared under the direction and in the presence of Shon Baton, MD . Electronically Signed: Freida Cohen, Scribe. 11/19/2015. 12:59 AM.      Chief Complaint  Patient presents with  . Shortness of Breath  . Chest Pain     The history is provided by the patient. No language interpreter was used.   HPI Comments:  Danielle Cohen is a 40 y.o. female who presents to the Emergency Department complaining of SOB x 3 days. Pt reports associated 8/10 right sided CP that radiates into her right shoulder. She describes her pain as a pressure. She notes h/o same x a few months. Pt denies cough, fever, and leg swelling. She also denies recent travel and h/o blood clots. Pt is currently on birth control. No alleviating factors noted.  Past Medical History  Diagnosis Date  . Thyroid disease     told had a thyroid cyst 11/11- needs recheck in 6 months, not done  . Gallstones   . Cholecystitis 04/2012   Past Surgical History  Procedure Laterality Date  . Appendectomy      in [place]  . Vaginal births      6 births- 1 set of twins 77yrs- 6months  . Cholecystectomy  04/06/2012    Procedure: LAPAROSCOPIC CHOLECYSTECTOMY WITH INTRAOPERATIVE CHOLANGIOGRAM;  Surgeon: Atilano Ina, MD,FACS;  Location: [facility] OR;  Service: General;  Laterality: N/A;   Family History  Problem Relation Age of Onset  . Anesthesia problems Neg Hx    Social History  Substance Use Topics  . Smoking status: Never Smoker   . Smokeless tobacco: Never Used  . Alcohol Use: No   OB History    Gravida Para Term Preterm AB TAB SAB Ectopic Multiple Living   0 0 0 0 0 1 6     Review of Systems  Constitutional: Negative for fever.  Respiratory: Positive for shortness of breath. Negative for cough.   Cardiovascular: Positive for chest pain. Negative for leg swelling.  All  other systems reviewed and are negative.     Allergies  Review of patient's allergies indicates no known allergies.  Home Medications   Prior to Admission medications   Medication Sig Start Date End Date Taking? Authorizing Provider  albuterol (PROVENTIL HFA;VENTOLIN HFA) 108 (90 BASE) MCG/ACT inhaler Inhale 2 puffs into the lungs every 4 (four) hours as needed for wheezing or shortness of breath. 07/29/15   Cheri Fowler, PA-C  azithromycin (ZITHROMAX) 250 MG tablet Take 1 tablet (250 mg total) by mouth daily. Take first 2 tablets together, then 1 every day until finished. 07/29/15   Cheri Fowler, PA-C  benzonatate (TESSALON) 200 MG capsule Take 1 capsule (200 mg total) by mouth 2 (two) times daily as needed for cough. 07/31/15   Ambrose Finland, NP  ibuprofen (ADVIL,MOTRIN) 600 MG tablet Take 1 tablet (600 mg total) by mouth every 6 (six) hours as needed. 11/19/15   Shon Baton, MD  loratadine (CLARITIN) 10 MG tablet Take 1 tablet (10 mg total) by mouth daily. 07/31/15   Ambrose Finland, NP  norethindrone-ethinyl estradiol (MICROGESTIN,JUNEL,LOESTRIN) 1-20 MG-MCG tablet Take 1 tablet by mouth daily. 07/31/15   Ambrose Finland, NP   BP 92/58 mmHg  Pulse 86  Temp(Src) 98.7 F (37.1 C) (Oral)  Resp 21  Ht   (1.651 m)  Wt 208 lb (94.348 kg)  BMI 34.61 kg/m2  SpO2 99%  LMP 10/27/2015 Physical Exam  Constitutional: She is oriented to person, place, and time. She appears well-developed and well-nourished.  HENT:  Head: Normocephalic and atraumatic.  Cardiovascular: Normal rate, regular rhythm and normal heart sounds.   Pulmonary/Chest: Effort normal. No respiratory distress. She has no wheezes. She exhibits tenderness.  Abdominal: Soft. Bowel sounds are normal. There is no tenderness. There is no rebound.  Musculoskeletal: She exhibits no edema.  Neurological: She is alert and oriented to person, place, and time.  Skin: Skin is warm and dry.  Psychiatric: She has a normal mood and  affect.  Nursing note and vitals reviewed.   ED Course  Procedures   DIAGNOSTIC STUDIES:  Oxygen Saturation is 98% on RA, normal by my interpretation.    COORDINATION OF CARE:  12:55 AM Discussed treatment plan with pt at bedside and pt agreed to plan.  Labs Review Labs Reviewed  BASIC METABOLIC PANEL - Abnormal; Notable for the following:    Glucose, Bld 111 (*)    All other components within normal limits  CBC - Abnormal; Notable for the following:    RBC 3.75 (*)    Hemoglobin 11.7 (*)    HCT 34.9 (*)    Platelets 415 (*)    All other components within normal limits  D-DIMER, QUANTITATIVE (NOT AT [facility]) - Abnormal; Notable for the following:    D-Dimer, Quant 0.63 (*)    All other components within normal limits  I-STAT TROPOININ, ED    Imaging Review Dg Chest 2 View  11/18/2015  CLINICAL DATA:  Acute onset of left-sided chest pain and shortness of breath. Initial encounter. EXAM: CHEST  2 VIEW COMPARISON:  Chest radiograph performed 07/29/2015, and CTA of the chest performed 03/02/2015 FINDINGS: The lungs are well-aerated and clear. There is no evidence of focal opacification, pleural effusion or pneumothorax. The heart is normal in size; the mediastinal contour is within normal limits. No acute osseous abnormalities are seen. IMPRESSION: No acute cardiopulmonary process seen. Electronically Signed   By: Roanna Raider M.D.   On: 11/18/2015 22:51   Ct Angio Chest Pe W/cm &/or Wo Cm  11/19/2015  CLINICAL DATA:  Acute onset of shortness of breath and generalized chest pain. Initial encounter. EXAM: CT ANGIOGRAPHY CHEST WITH CONTRAST TECHNIQUE: Multidetector CT imaging of the chest was performed using the standard protocol during bolus administration of intravenous contrast. Multiplanar CT image reconstructions and MIPs were obtained to evaluate the vascular anatomy. CONTRAST:  OMNIPAQUE IOHEXOL 350 MG/ML SOLN COMPARISON:  Chest radiograph performed 11/18/2015, and CTA of  the chest performed 03/02/2015 FINDINGS: There is no evidence of pulmonary embolus. Minimal bibasilar atelectasis is noted. The lungs are otherwise clear. There is no evidence of significant focal consolidation, pleural effusion or pneumothorax. No masses are identified; no abnormal focal contrast enhancement is seen. The mediastinum is unremarkable in appearance. No mediastinal lymphadenopathy is seen. No pericardial effusion is identified. The great vessels are grossly unremarkable in appearance. No axillary lymphadenopathy is seen. A 1.7 cm hypodensity is noted within the right thyroid lobe. A smaller hypodensity is suggested within the left thyroid lobe. The visualized portions of the liver and spleen are unremarkable. The patient is status post cholecystectomy, with clips noted at the gallbladder fossa. No acute osseous abnormalities are seen. Review of the MIP images confirms the above findings. IMPRESSION: 1. No evidence of pulmonary embolus. 2. Minimal bibasilar  atelectasis noted.  Lungs otherwise clear. 3. 1.7 cm hypodensity within the right thyroid lobe. Consider further evaluation with thyroid ultrasound. If patient is clinically hyperthyroid, consider nuclear medicine thyroid uptake and scan. Electronically Signed   By: Roanna Raider M.D.   On: 11/19/2015 04:02   I have personally reviewed and evaluated these images and lab results as part of my medical decision-making.   EKG Interpretation   Date/Time:  Tuesday November 18 2015 22:08:39 EST Ventricular Rate:  99 PR Interval:  148 QRS Duration: 70 QT Interval:  352 QTC Calculation: 451 R Axis:   42 Text Interpretation:  Normal sinus rhythm Nonspecific ST abnormality  Abnormal ECG Confirmed by Tessia Kassin  MD, Shakyia Bosso ([id number]) on 11/19/2015  12:38:02 AM      MDM   Final diagnoses:  Other chest pain  Thyroid nodule    Patient presents for chest pain shortness of breath. Waxing and waning of last several months. Worsening over the last  3 days. Nontoxic on exam. No infectious symptoms. Afebrile. She does have reproducible tenderness on exam. She is low risk for PE. D-dimer sent. EKG is nonischemic. Chest x-ray is negative. D-dimer is slightly elevated. Will obtain CT. Patient reports improvement with ibuprofen. Troponin is negative. Doubt ACS. Patient is otherwise low risk. CT scan is negative. It does show evidence of a thyroid nodule. Patient is aware of this and has had a biopsy per the patient. Follow-up with primary physician.   After history, exam, and medical workup I feel the patient has been appropriately medically screened and is safe for discharge home. Pertinent diagnoses were discussed with the patient. Patient was given return precautions.  I personally performed the services described in this documentation, which was scribed in my presence. The recorded information has been reviewed and is accurate.    Shon Baton, MD 11/19/15 [phone]

## 2015-11-19 NOTE — ED Notes (Signed)
Horton, MD at bedside.  

## 2015-11-19 NOTE — Discharge Instructions (Signed)

## 2016-02-28 ENCOUNTER — Encounter (HOSPITAL_COMMUNITY): Payer: Self-pay | Admitting: Nurse Practitioner

## 2016-02-28 ENCOUNTER — Emergency Department (HOSPITAL_COMMUNITY)
Admission: EM | Admit: 2016-02-28 | Discharge: 2016-02-28 | Disposition: A | Discharge status: Home / Self Care | Payer: No Typology Code available for payment source | Attending: Emergency Medicine | Admitting: Emergency Medicine

## 2016-02-28 DIAGNOSIS — Z8639 Personal history of other endocrine, nutritional and metabolic disease: Secondary | ICD-10-CM | POA: Insufficient documentation

## 2016-02-28 DIAGNOSIS — Z79899 Other long term (current) drug therapy: Secondary | ICD-10-CM | POA: Insufficient documentation

## 2016-02-28 DIAGNOSIS — M545 Low back pain, unspecified: Secondary | ICD-10-CM

## 2016-02-28 DIAGNOSIS — Z3202 Encounter for pregnancy test, result negative: Secondary | ICD-10-CM | POA: Insufficient documentation

## 2016-02-28 LAB — POC URINE PREG, ED: Preg Test, Ur: NEGATIVE

## 2016-02-28 MED ORDER — CYCLOBENZAPRINE HCL 10 MG PO TABS: 10.0000 mg | ORAL_TABLET | Freq: Two times a day (BID) | ORAL | Status: DC | PRN

## 2016-02-28 MED ORDER — NAPROXEN 500 MG PO TABS: 500.0000 mg | ORAL_TABLET | Freq: Two times a day (BID) | ORAL | Status: DC

## 2016-02-28 MED ORDER — HYDROCODONE-ACETAMINOPHEN 5-325 MG PO TABS
1.0000 | ORAL_TABLET | Freq: Once | ORAL | Status: AC
  Administered 2016-02-28: 1 via ORAL
  Filled 2016-02-28: qty 1

## 2016-02-28 NOTE — Discharge Instructions (Signed)
Back Pain, Adult Back pain is very common in adults.The cause of back pain is rarely dangerous and the pain often gets better over time.The cause of your back pain may not be known. Some common causes of back pain include:  Strain of the muscles or ligaments supporting the spine.  Wear and tear (degeneration) of the spinal disks.  Arthritis.  Direct injury to the back. For many people, back pain may return. Since back pain is rarely dangerous, most people can learn to manage this condition on their own. HOME CARE INSTRUCTIONS Watch your back pain for any changes. The following actions may help to lessen any discomfort you are feeling:  Remain active. It is stressful on your back to sit or stand in one place for long periods of time. Do not sit, drive, or stand in one place for more than 30 minutes at a time. Take short walks on even surfaces as soon as you are able.Try to increase the length of time you walk each day.  Exercise regularly as directed by your health care provider. Exercise helps your back heal faster. It also helps avoid future injury by keeping your muscles strong and flexible.  Do not stay in bed.Resting more than 1-2 days can delay your recovery.  Pay attention to your body when you bend and lift. The most comfortable positions are those that put less stress on your recovering back. Always use proper lifting techniques, including:  Bending your knees.  Keeping the load close to your body.  Avoiding twisting.  Find a comfortable position to sleep. Use a firm mattress and lie on your side with your knees slightly bent. If you lie on your back, put a pillow under your knees.  Avoid feeling anxious or stressed.Stress increases muscle tension and can worsen back pain.It is important to recognize when you are anxious or stressed and learn ways to manage it, such as with exercise.  Take medicines only as directed by your health care provider. Over-the-counter  medicines to reduce pain and inflammation are often the most helpful.Your health care provider may prescribe muscle relaxant drugs.These medicines help dull your pain so you can more quickly return to your normal activities and healthy exercise.  Apply ice to the injured area:  Put ice in a plastic bag.  Place a towel between your skin and the bag.  Leave the ice on for 20 minutes, 2-3 times a day for the first 2-3 days. After that, ice and heat may be alternated to reduce pain and spasms.  Maintain a healthy weight. Excess weight puts extra stress on your back and makes it difficult to maintain good posture. SEEK MEDICAL CARE IF:  You have pain that is not relieved with rest or medicine.  You have increasing pain going down into the legs or buttocks.  You have pain that does not improve in one week.  You have night pain.  You lose weight.  You have a fever or chills. SEEK IMMEDIATE MEDICAL CARE IF:   You develop new bowel or bladder control problems.  You have unusual weakness or numbness in your arms or legs.  You develop nausea or vomiting.  You develop abdominal pain.  You feel faint.   This information is not intended to replace advice given to you by your health care provider. Make sure you discuss any questions you have with your health care provider.     Pregnancy test was negative today if you continue to have difficulty having a  menstrual cycle please follow up with her OB/GYN. Apply ice to your back as needed. Take Flexeril and Naprosyn as needed for pain and inflammation. Avoid heavy lifting or strenuous activity until your back to have resolved. Please return to the emergency department if you experience severe worsening of your pain, abdominal pain, loss of consciousness, bowel or bladder incontinence, numbness or tingling in both of your lower extremities.

## 2016-02-28 NOTE — ED Notes (Signed)
Pt c/o onset lower back pain last night. She denies any noted activity at onset. She denies any bowel/bladder changes. She denies past history of back pain. She tried tylenol with minimal relief. She is ambulatory, mae.

## 2016-02-28 NOTE — ED Provider Notes (Signed)
CSN: [id number]     Arrival date & time 02/28/16  1241 History   First MD Initiated Contact with Patient 02/28/16 1423     Chief Complaint  Patient presents with  . Back Pain     (Consider location/radiation/quality/duration/timing/severity/associated sxs/prior Treatment) HPI   Nykia E Walby is a 39 year old female with past medical history of thyroid disease who presents the ED today complaining of back pain. Patient states that last she stood up to go to the bathroom and felt sudden onset right lower back pain. The pain is worsened with standing and walking. Patient states she is unable to lift anything heavy as the pain increases with this activity. She has not tried taking anything for her symptoms. She states this happened to her once before and was given muscle relaxers with relief. She denies bowel or bladder incontinence, saddle anesthesia, fever, IV drug use, difficulty urinating. Patient also states that she has not had a menstrual cycle in 2 months. She is currently taking birth control pills. She is sexually active with one partner. She states this is also happen to her before and her cycle returned after a couple of months. She denies any nausea, vomiting, abdominal pain.  Past Medical History  Diagnosis Date  . Thyroid disease     told had a thyroid cyst 11/11- needs recheck in 6 months, not done  . Gallstones   . Cholecystitis 04/2012   Past Surgical History  Procedure Laterality Date  . Appendectomy      in [place]  . Vaginal births      6 births- 1 set of twins 72yrs- 6months  . Cholecystectomy  04/06/2012    Procedure: LAPAROSCOPIC CHOLECYSTECTOMY WITH INTRAOPERATIVE CHOLANGIOGRAM;  Surgeon: Atilano Ina, MD,FACS;  Location: [facility] OR;  Service: General;  Laterality: N/A;   Family History  Problem Relation Age of Onset  . Anesthesia problems Neg Hx    Social History  Substance Use Topics  . Smoking status: Never Smoker   . Smokeless tobacco: Never Used  . Alcohol  Use: No   OB History    Gravida Para Term Preterm AB TAB SAB Ectopic Multiple Living   0 0 0 0 0 1 6     Review of Systems  All other systems reviewed and are negative.     Allergies  Review of patient's allergies indicates no known allergies.  Home Medications   Prior to Admission medications   Medication Sig Start Date End Date Taking? Authorizing Provider  albuterol (PROVENTIL HFA;VENTOLIN HFA) 108 (90 BASE) MCG/ACT inhaler Inhale 2 puffs into the lungs every 4 (four) hours as needed for wheezing or shortness of breath. 07/29/15   Cheri Fowler, PA-C  azithromycin (ZITHROMAX) 250 MG tablet Take 1 tablet (250 mg total) by mouth daily. Take first 2 tablets together, then 1 every day until finished. 07/29/15   Cheri Fowler, PA-C  benzonatate (TESSALON) 200 MG capsule Take 1 capsule (200 mg total) by mouth 2 (two) times daily as needed for cough. 07/31/15   Ambrose Finland, NP  ibuprofen (ADVIL,MOTRIN) 600 MG tablet Take 1 tablet (600 mg total) by mouth every 6 (six) hours as needed. 11/19/15   Shon Baton, MD  loratadine (CLARITIN) 10 MG tablet Take 1 tablet (10 mg total) by mouth daily. 07/31/15   Ambrose Finland, NP  norethindrone-ethinyl estradiol (MICROGESTIN,JUNEL,LOESTRIN) 1-20 MG-MCG tablet Take 1 tablet by mouth daily. 07/31/15   Ambrose Finland, NP   BP 101/70 mmHg  Pulse 105  Temp(Src) 98.6 F (37 C) (Oral)  Resp 18  SpO2 100% Physical Exam  Constitutional: She is oriented to person, place, and time. She appears well-developed and well-nourished. No distress.  HENT:  Head: Normocephalic and atraumatic.  Eyes: Conjunctivae are normal. Right eye exhibits no discharge. Left eye exhibits no discharge. No scleral icterus.  Cardiovascular: Normal rate.   Pulmonary/Chest: Effort normal.  Abdominal: Soft. Bowel sounds are normal. She exhibits no distension and no mass. There is no tenderness. There is no rebound and no guarding.  Musculoskeletal:  Mild TTP of right lumbar  paraspinal muscles. No midline spinal tenderness. Full range of motion of seek, T, L-spine. No step-offs or obvious bony deformities.  Neurological: She is alert and oriented to person, place, and time. Coordination normal.  Strength 5/5 throughout. No sensory deficits.  No gait abnormality.  Skin: Skin is warm and dry. No rash noted. She is not diaphoretic. No erythema. No pallor.  Psychiatric: She has a normal mood and affect. Her behavior is normal.  Nursing note and vitals reviewed.   ED Course  Procedures (including critical care time) Labs Review Labs Reviewed  POC URINE PREG, ED    Imaging Review No results found. I have personally reviewed and evaluated these images and lab results as part of my medical decision-making.   EKG Interpretation None      MDM   Final diagnoses:  Right-sided low back pain without sciatica    Patient with back pain,likely muscle strain.  No neurological deficits and normal neuro exam. Pt is ambulatory in the ED without difficulty. No loss of bowel or bladder control.  No concern for cauda equina.  No fever, night sweats, weight loss, h/o cancer, IVDU. Pregnancy test is negative. RICE protocol and pain medicine indicated and discussed with patient.      Lester KinsmanSamantha Tripp [place], PA-C 02/28/16 1525  Linwood DibblesJon Knapp, MD 02/28/16 2037

## 2016-03-01 ENCOUNTER — Encounter (HOSPITAL_COMMUNITY): Payer: Self-pay | Admitting: Emergency Medicine

## 2016-03-01 ENCOUNTER — Emergency Department (HOSPITAL_COMMUNITY)
Admission: EM | Admit: 2016-03-01 | Discharge: 2016-03-01 | Disposition: A | Discharge status: Home / Self Care | Payer: No Typology Code available for payment source | Attending: Emergency Medicine | Admitting: Emergency Medicine

## 2016-03-01 DIAGNOSIS — M545 Low back pain, unspecified: Secondary | ICD-10-CM

## 2016-03-01 DIAGNOSIS — N390 Urinary tract infection, site not specified: Secondary | ICD-10-CM | POA: Insufficient documentation

## 2016-03-01 DIAGNOSIS — Z8719 Personal history of other diseases of the digestive system: Secondary | ICD-10-CM | POA: Insufficient documentation

## 2016-03-01 DIAGNOSIS — Z8639 Personal history of other endocrine, nutritional and metabolic disease: Secondary | ICD-10-CM | POA: Insufficient documentation

## 2016-03-01 DIAGNOSIS — Z79899 Other long term (current) drug therapy: Secondary | ICD-10-CM | POA: Insufficient documentation

## 2016-03-01 DIAGNOSIS — Z791 Long term (current) use of non-steroidal anti-inflammatories (NSAID): Secondary | ICD-10-CM | POA: Insufficient documentation

## 2016-03-01 DIAGNOSIS — Z79818 Long term (current) use of other agents affecting estrogen receptors and estrogen levels: Secondary | ICD-10-CM | POA: Insufficient documentation

## 2016-03-01 LAB — URINE MICROSCOPIC-ADD ON

## 2016-03-01 LAB — URINALYSIS, ROUTINE W REFLEX MICROSCOPIC
BILIRUBIN URINE: NEGATIVE
Glucose, UA: NEGATIVE mg/dL
Ketones, ur: NEGATIVE mg/dL
NITRITE: NEGATIVE
PROTEIN: NEGATIVE mg/dL
SPECIFIC GRAVITY, URINE: 1.021 (ref 1.005–1.030)
pH: 6 (ref 5.0–8.0)

## 2016-03-01 MED ORDER — CEPHALEXIN 500 MG PO CAPS: 500.0000 mg | ORAL_CAPSULE | Freq: Two times a day (BID) | ORAL | Status: DC

## 2016-03-01 NOTE — ED Provider Notes (Signed)
CSN: [id number]     Arrival date & time 03/01/16  1301 History  By signing my name below, I, Emmanuella Mensah, attest that this documentation has been prepared under the direction and in the presence of Chikita Dogan, PA-C. Electronically Signed: Angelene GiovanniEmmanuella Mensah, ED Scribe. 03/01/2016. 3:10 PM.    Chief Complaint  Patient presents with  . Back Pain   The history is provided by the patient. No language interpreter was used.   HPI Comments: Danielle Cohen is a 40 y.o. female with a hx of thyroid disease who presents to the Emergency Department complaining of gradually worsening lower back pain onset 4 days ago. She was seen in the ED two days ago for same complaint and discharged with naprosyn and muscle relaxer. The back pain is in the lumbar region and located on the right side. The pain is aching in nature. She states that the pain worsens with movement and also notes that it worsens with urination. This is a change in her back since her last ED visit. Pt reports associated dysuria that started yesterday. She states that she is here today because her pain is persisting despite the medications given. She denies any fever, chills, abdominal pain, n/v, vaginal discharge, pelvic pain, bowel/bladder incontinence, pain radiating into lower extremities, lower extremity numbness, lower extremities weakness or gait issues.     Past Medical History  Diagnosis Date  . Thyroid disease     told had a thyroid cyst 11/11- needs recheck in 6 months, not done  . Gallstones   . Cholecystitis 04/2012   Past Surgical History  Procedure Laterality Date  . [place]ectomy      [place]  . Vaginal births      6 births- 1 set of twins 4270yrs- 6months  . Cholecystectomy  04/06/2012    Procedure: LAPAROSCOPIC CHOLECYSTECTOMY WITH INTRAOPERATIVE CHOLANGIOGRAM;  Surgeon: Atilano InaEric M Wilson, MD,FACS;  Location: [facility] OR;  Service: General;  Laterality: N/A;   Family History  Problem Relation Age of Onset  . Anesthesia  problems Neg Hx    Social History  Substance Use Topics  . Smoking status: Never Smoker   . Smokeless tobacco: Never Used  . Alcohol Use: No   OB History    Gravida Para Term Preterm AB TAB SAB Ectopic Multiple Living   5 5 5  0 0 0 0 0 1 6     Review of Systems  Constitutional: Negative for fever and chills.  Gastrointestinal: Negative for nausea and vomiting.  Genitourinary: Positive for dysuria.  Musculoskeletal: Positive for back pain.  All other systems reviewed and are negative.     Allergies  Review of patient's allergies indicates no known allergies.  Home Medications   Prior to Admission medications   Medication Sig Start Date End Date Taking? Authorizing Provider  albuterol (PROVENTIL HFA;VENTOLIN HFA) 108 (90 BASE) MCG/ACT inhaler Inhale 2 puffs into the lungs every 4 (four) hours as needed for wheezing or shortness of breath. 07/29/15   Cheri FowlerKayla Rose, PA-C  azithromycin (ZITHROMAX) 250 MG tablet Take 1 tablet (250 mg total) by mouth daily. Take first 2 tablets together, then 1 every day until finished. 07/29/15   Cheri FowlerKayla Rose, PA-C  benzonatate (TESSALON) 200 MG capsule Take 1 capsule (200 mg total) by mouth 2 (two) times daily as needed for cough. 07/31/15   Ambrose FinlandValerie A Keck, NP  cephALEXin (KEFLEX) 500 MG capsule Take 1 capsule (500 mg total) by mouth 2 (two) times daily. 03/01/16   Rolm GalaStevi Remus Hagedorn,  PA-C  cyclobenzaprine (FLEXERIL) 10 MG tablet Take 1 tablet (10 mg total) by mouth 2 (two) times daily as needed for muscle spasms. 02/28/16   Samantha Tripp Dowless, PA-C  ibuprofen (ADVIL,MOTRIN) 600 MG tablet Take 1 tablet (600 mg total) by mouth every 6 (six) hours as needed. 11/19/15   Shon Baton, MD  loratadine (CLARITIN) 10 MG tablet Take 1 tablet (10 mg total) by mouth daily. 07/31/15   Ambrose Finland, NP  naproxen (NAPROSYN) 500 MG tablet Take 1 tablet (500 mg total) by mouth 2 (two) times daily. 02/28/16   Samantha Tripp Dow[place], PA-C  norethindrone-ethinyl estradiol  (MICROGESTIN,JUNEL,LOESTRIN) 1-20 MG-MCG tablet Take 1 tablet by mouth daily. 07/31/15   Ambrose Finland, NP   BP 108/68 mmHg  Pulse 71  Temp(Src) 98.8 F (37.1 C) (Oral)  Resp 18  Ht  (1.702 m)  Wt 90.719 kg  BMI 31.32 kg/m2  SpO2 100%  LMP 12/27/2015 (Approximate) Physical Exam  Constitutional: She appears well-developed and well-nourished. No distress.  Nontoxic appearing  HENT:  Head: Normocephalic and atraumatic.  Right Ear: External ear normal.  Left Ear: External ear normal.  Eyes: Conjunctivae are normal. Right eye exhibits no discharge. Left eye exhibits no discharge. No scleral icterus.  Neck: Normal range of motion.  Cardiovascular: Normal rate and intact distal pulses.   Pulmonary/Chest: Effort normal.  Abdominal: Soft. She exhibits no distension. There is no tenderness.  Musculoskeletal: Normal range of motion.       Lumbar back: She exhibits tenderness. She exhibits normal range of motion and no deformity.       Back:  Mild right lumbar TTP. No midline L spine tenderness or deformity. FROM intact.   Neurological: She is alert. Coordination normal.  5/5 strength of BLE. Sensation to light touch intact throughout. Walks with a steady gait.   Skin: Skin is warm and dry.  Psychiatric: She has a normal mood and affect. Her behavior is normal.  Nursing note and vitals reviewed.   ED Course  Procedures (including critical care time) DIAGNOSTIC STUDIES: Oxygen Saturation is 100% on RA, normal by my interpretation.    COORDINATION OF CARE: 3:05 PM- Pt advised of plan for treatment and pt agrees. Pt will provide urine sample for UA for further evaluation.    Labs Review Labs Reviewed  URINALYSIS, ROUTINE W REFLEX MICROSCOPIC (NOT AT [facility]) - Abnormal; Notable for the following:    APPearance CLOUDY (*)    Hgb urine dipstick SMALL (*)    Leukocytes, UA SMALL (*)    All other components within normal limits  URINE MICROSCOPIC-ADD ON - Abnormal; Notable for the  following:    Squamous Epithelial / LPF 6-30 (*)    Bacteria, UA RARE (*)    All other components within normal limits  URINE CULTURE    Rylah Fukuda, PA-C has personally reviewed and evaluated these lab results as part of her medical decision-making.  MDM   Final diagnoses:  Right-sided low back pain without sciatica  UTI (lower urinary tract infection)   Patient presenting with back pain x 4 days. Now having worsening pain with urination and dysuria.  Afebrile. Non-focal neuro exam. Right sided lumbar TTP without focal spine tenderness. Patient is able to ambulate without discomfort. No loss of bowel or bladder control. No numbness or weakness in the lower extremities. No concern for cauda equina. No history of IVDU or cancer. Urinalysis softly suggestive of UTI with small leuks and rare bacteria. Given new  urinary symptoms; will send for culture and tx with keflex for presumptive UTI. Pt will not need to continue rx if culture results negative. Strongly encouraged pt to follow up with PCP in 2-3 days to monitor response to treatment. Return precautions discussed and given in discharge paperwork. Pt is stable for discharge.   I personally performed the services described in this documentation, which was scribed in my presence. The recorded information has been reviewed and is accurate.    Alveta Heimlich, PA-C 03/01/16 1754  Benjiman Core, MD 03/02/16 [phone]

## 2016-03-01 NOTE — ED Notes (Signed)
Pt here with lower back pain, worsening over last several days. Pt was seen here on Friday and sent home with pain meds and muscle relaxer but sts no relief.

## 2016-03-01 NOTE — Discharge Instructions (Signed)

## 2016-03-03 LAB — URINE CULTURE

## 2016-07-04 IMAGING — CR DG CHEST 2V
2 series · 2 of 2 positions shown · non-contrast
Comparison: Chest radiograph January 19, 2014

CLINICAL DATA: Shortness of breath and chest pressure for several
days. Similar symptoms last year relieved by inhaler.

EXAM:
CHEST  2 VIEW

[chest pa]
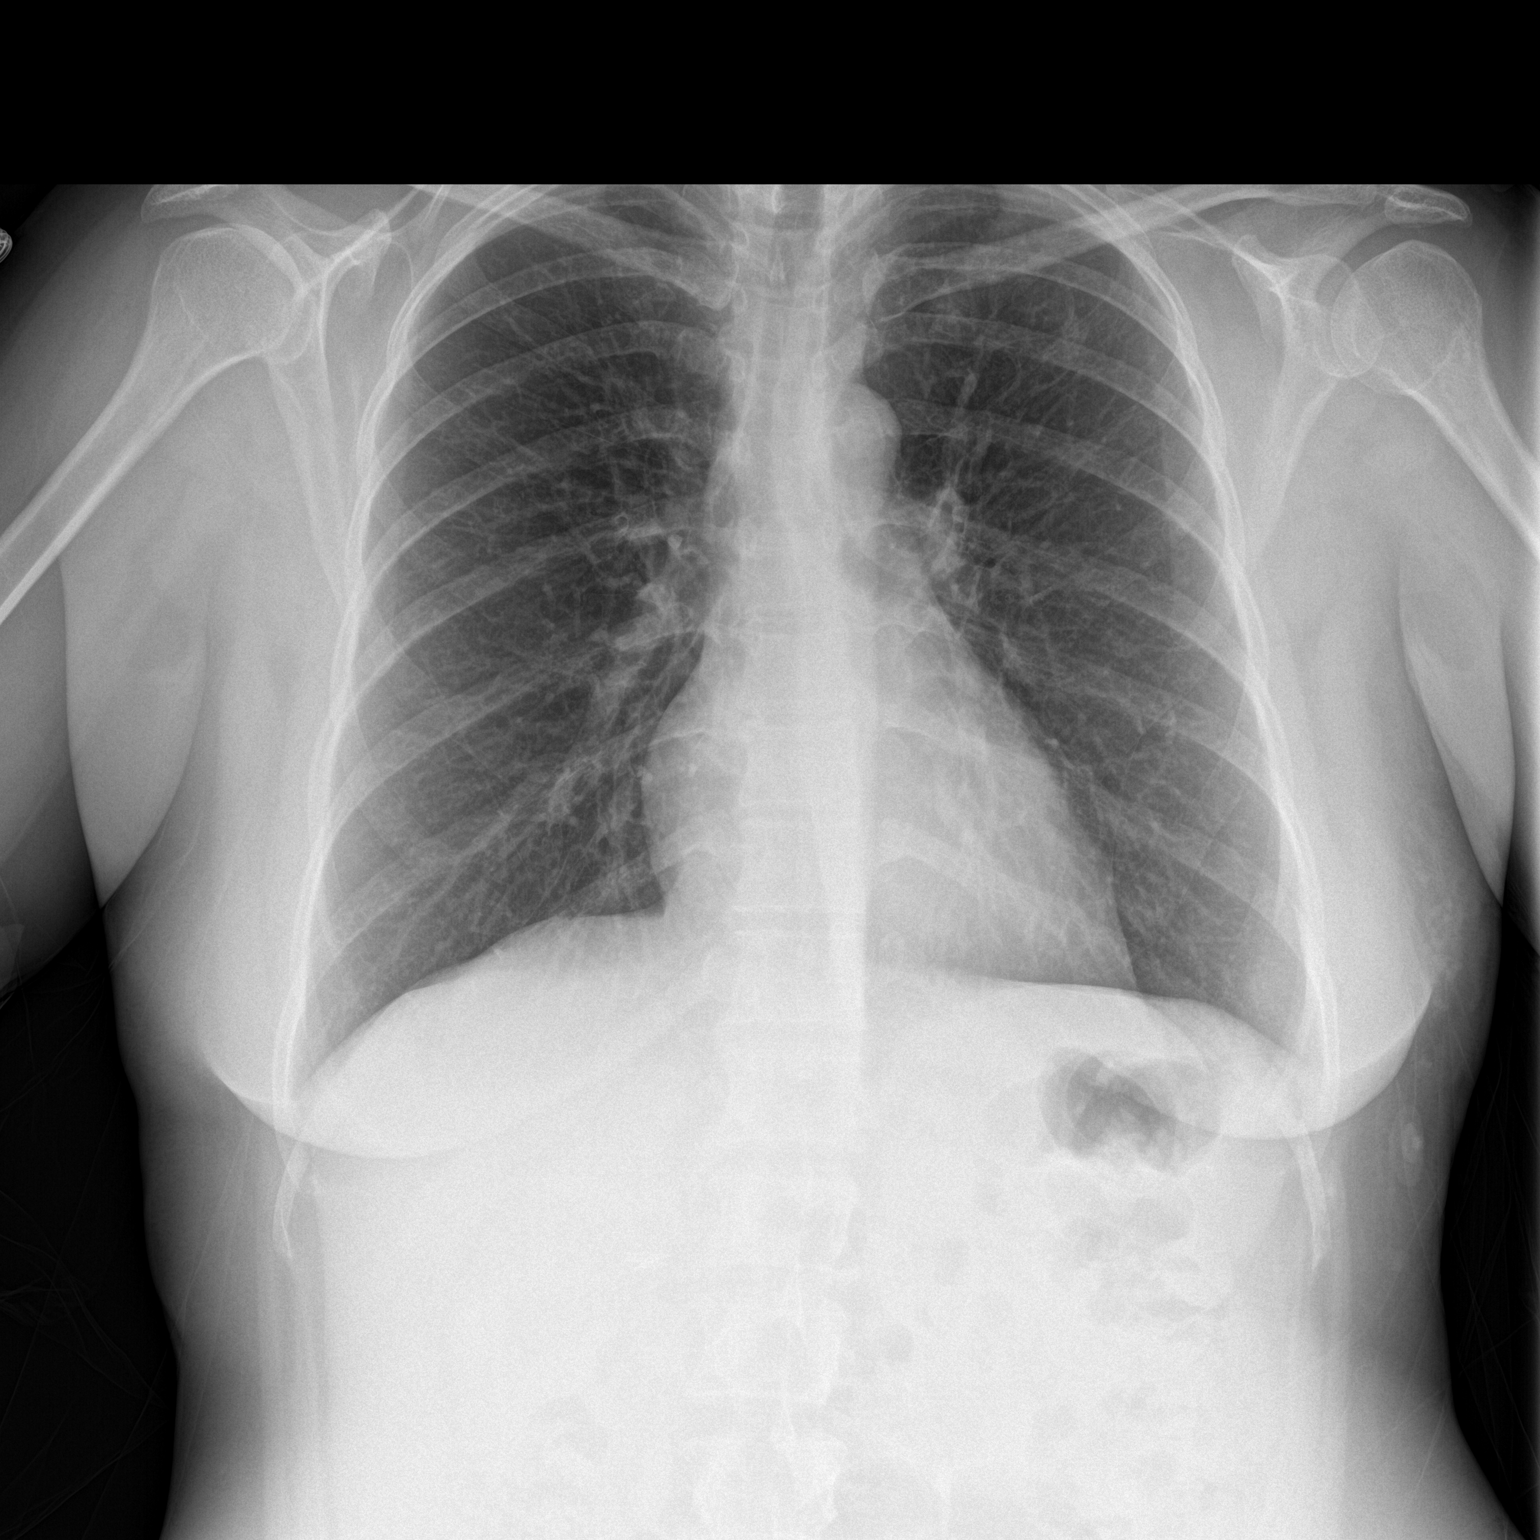

[chest lat]
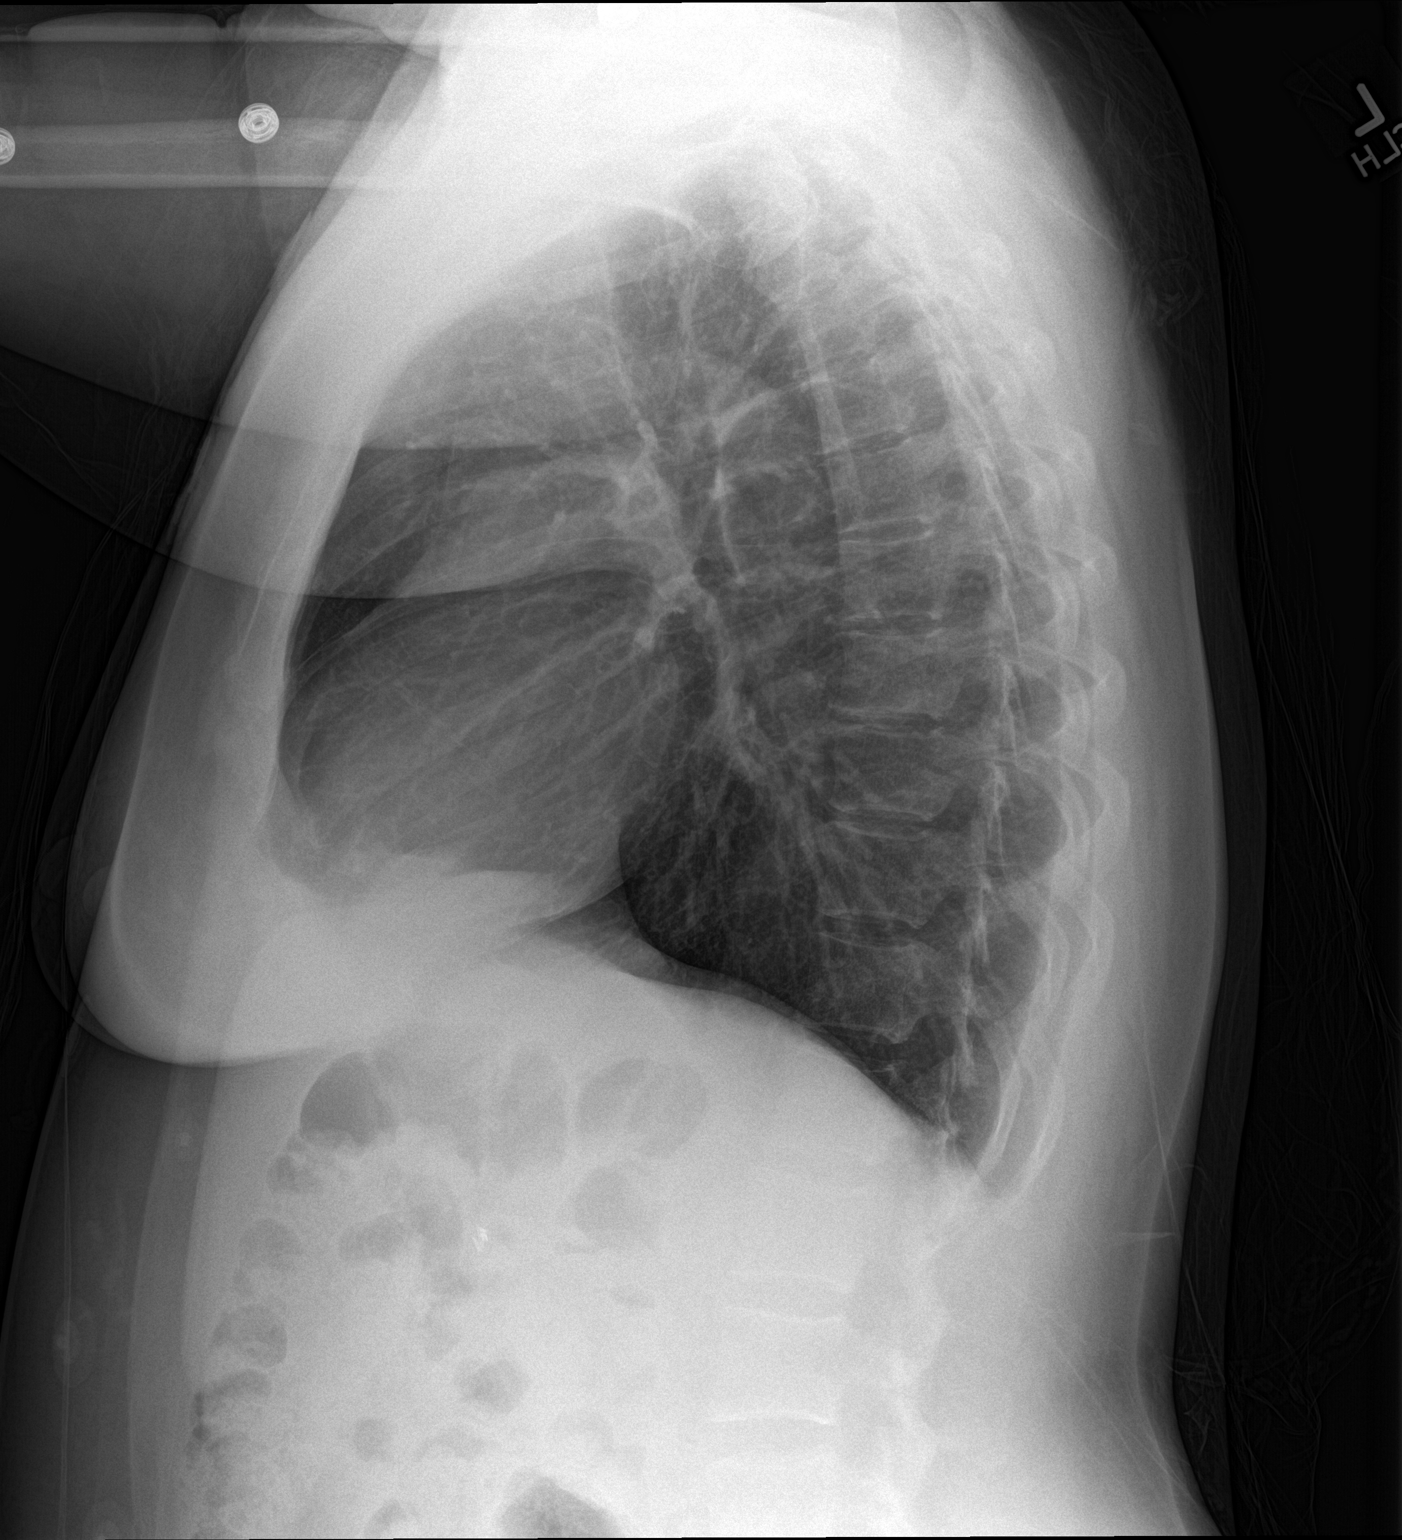

[2 of 2 positions shown; findings below may reference images not displayed]

FINDINGS: Cardiomediastinal silhouette is unremarkable. The lungs are clear
without pleural effusions or focal consolidations. Trachea projects
midline and there is no pneumothorax. Soft tissue planes and
included osseous structures are non-suspicious. Surgical clips in
the included right abdomen likely reflect cholecystectomy.
IMPRESSION: No acute cardiopulmonary process, stable appearance of the chest
from January 19, 2014.

By: Rtoyota Joshjax

## 2017-02-28 ENCOUNTER — Ambulatory Visit: Payer: No Typology Code available for payment source | Attending: Internal Medicine

## 2017-03-14 ENCOUNTER — Ambulatory Visit: Payer: No Typology Code available for payment source | Admitting: Family Medicine

## 2017-03-14 ENCOUNTER — Ambulatory Visit: Payer: Self-pay | Attending: Family Medicine | Admitting: Family Medicine

## 2017-03-14 VITALS — BP 93/63 | HR 88 | Temp 98.0°F | Resp 18 | Ht 65.0 in | Wt 203.6 lb

## 2017-03-14 DIAGNOSIS — K469 Unspecified abdominal hernia without obstruction or gangrene: Secondary | ICD-10-CM | POA: Insufficient documentation

## 2017-03-14 DIAGNOSIS — Z789 Other specified health status: Secondary | ICD-10-CM

## 2017-03-14 DIAGNOSIS — Z79899 Other long term (current) drug therapy: Secondary | ICD-10-CM | POA: Insufficient documentation

## 2017-03-14 DIAGNOSIS — K029 Dental caries, unspecified: Secondary | ICD-10-CM | POA: Insufficient documentation

## 2017-03-14 MED ORDER — IBUPROFEN 800 MG PO TABS: 800.0000 mg | ORAL_TABLET | Freq: Three times a day (TID) | ORAL | 0 refills | Status: DC | PRN

## 2017-03-14 MED ORDER — ONE DAILY MULTIVITAMIN WOMEN PO TABS: 1.0000 | ORAL_TABLET | Freq: Every day | ORAL | Status: DC

## 2017-03-14 MED ORDER — BENZOCAINE 10 % MT GEL: 1.0000 "application " | OROMUCOSAL | 0 refills | Status: DC | PRN

## 2017-03-14 MED FILL — IBUPROFEN 800 MG TABLET: 800 | 12 days supply | Qty: 40 | Fill #0

## 2017-03-14 NOTE — Patient Instructions (Signed)
Hernia A hernia happens when an organ or tissue inside your body pushes out through a weak spot in the belly (abdomen). Follow these instructions at home:  Avoid stretching or overusing (straining) the muscles near the hernia.  Do not lift anything heavier than 10 lb (4.5 kg).  Use the muscles in your leg when you lift something up. Do not use the muscles in your back.  When you cough, try to cough gently.  Eat a diet that has a lot of fiber. Eat lots of fruits and vegetables.  Drink enough fluids to keep your pee (urine) clear or pale yellow. Try to drink 6-8 glasses of water a day.  Take medicines to make your poop soft (stool softeners) as told by your doctor.  Lose weight, if you are overweight.  Do not use any tobacco products, including cigarettes, chewing tobacco, or electronic cigarettes. If you need help quitting, ask your doctor.  Keep all follow-up visits as told by your doctor. This is important. Contact a doctor if:  The skin by the hernia gets puffy (swollen) or red.  The hernia is painful. Get help right away if:  You have a fever.  You have belly pain that is getting worse.  You feel sick to your stomach (nauseous) or you throw up (vomit).  You cannot push the hernia back in place by gently pressing on it while you are lying down.  The hernia:  Changes in shape or size.  Is stuck outside your belly.  Changes color.  Feels hard or tender. This information is not intended to replace advice given to you by your health care provider. Make sure you discuss any questions you have with your health care provider. Document Released: 04/07/2010 Document Revised: 03/25/2016 Document Reviewed: 08/28/2014 Elsevier Interactive Patient Education  2017 [employer]  

## 2017-03-16 NOTE — Progress Notes (Signed)
Subjective:  Patient ID: Danielle Cohen, female    DOB: 02/08/1976  Age: 41 y.o. MRN: [id number]  CC: No chief complaint on file.   HPI Danielle Cohen presents for   Abdominal complaint: 3 months history. Reports lump in lower abdomen that worsens with coughing. Associated symptoms include postprandial abdominal fullness. She denies any constitutional symptoms, N/V, melena, or hematochezia. She reports history of laparoscopic gallbladder removal 5 to 6 years ago.   Dental caries: Worsened 2 month ago. Reports not having dental visit in several years. No abscess symptoms reported.   Outpatient Medications Prior to Visit  Medication Sig Dispense Refill  . albuterol (PROVENTIL HFA;VENTOLIN HFA) 108 (90 BASE) MCG/ACT inhaler Inhale 2 puffs into the lungs every 4 (four) hours as needed for wheezing or shortness of breath. 1 Inhaler 0  . azithromycin (ZITHROMAX) 250 MG tablet Take 1 tablet (250 mg total) by mouth daily. Take first 2 tablets together, then 1 every day until finished. 6 tablet 0  . benzonatate (TESSALON) 200 MG capsule Take 1 capsule (200 mg total) by mouth 2 (two) times daily as needed for cough. 20 capsule 1  . cephALEXin (KEFLEX) 500 MG capsule Take 1 capsule (500 mg total) by mouth 2 (two) times daily. 14 capsule 0  . cyclobenzaprine (FLEXERIL) 10 MG tablet Take 1 tablet (10 mg total) by mouth 2 (two) times daily as needed for muscle spasms. 20 tablet 0  . loratadine (CLARITIN) 10 MG tablet Take 1 tablet (10 mg total) by mouth daily. 30 tablet 11  . naproxen (NAPROSYN) 500 MG tablet Take 1 tablet (500 mg total) by mouth 2 (two) times daily. 30 tablet 0  . norethindrone-ethinyl estradiol (MICROGESTIN,JUNEL,LOESTRIN) 1-20 MG-MCG tablet Take 1 tablet by mouth daily. 3 Package 10  . ibuprofen (ADVIL,MOTRIN) 600 MG tablet Take 1 tablet (600 mg total) by mouth every 6 (six) hours as needed. 30 tablet 0   No facility-administered medications prior to visit.     ROS Review of  Systems  Constitutional: Negative.   HENT: Positive for dental problem.   Respiratory: Negative.   Cardiovascular: Negative.   Gastrointestinal: Positive for abdominal distention.       Abdominal lump  Skin: Negative.     Objective:  BP 93/63 (BP Location: Left Arm, Patient Position: Sitting, Cuff Size: Normal)   Pulse 88   Temp 98 F (36.7 C)   Resp 18   Ht 5\' 5"  (1.651 m)   Wt 203 lb 9.6 oz (92.4 kg)   PF 100 L/min   BMI 33.88 kg/m   BP/Weight 03/14/2017 03/01/2016 02/28/2016  Systolic BP 93 108 99  Diastolic BP 63 68 54  Wt. (Lbs) 203.6 200 -  BMI 33.88 31.32 -     Physical Exam  Constitutional: She appears well-developed and well-nourished.  HENT:  Head: Normocephalic and atraumatic.  Right Ear: External ear normal.  Left Ear: External ear normal.  Nose: Nose normal.  Mouth/Throat: Oropharynx is clear and moist. Dental caries present.  Eyes: Conjunctivae are normal. Pupils are equal, round, and reactive to light.  Neck: Normal range of motion. Neck supple. No JVD present.  Cardiovascular: Normal rate, regular rhythm, normal heart sounds and intact distal pulses.   Pulmonary/Chest: Effort normal and breath sounds normal.  Abdominal: Soft. Bowel sounds are normal. There is no rebound and no guarding. A hernia is present.    Lymphadenopathy:    She has no cervical adenopathy.  Skin: Skin is warm and dry.  Nursing note and vitals reviewed.   Assessment & Plan:   Problem List Items Addressed This Visit    None    Visit Diagnoses    Non-recurrent abdominal hernia without obstruction or gangrene, unspecified hernia type    -  Primary   Relevant Orders   Ambulatory referral to General Surgery   Dental caries       Relevant Medications   ibuprofen (ADVIL,MOTRIN) 800 MG tablet   benzocaine (ORAJEL) 10 % mucosal gel   Other Relevant Orders   Ambulatory referral to Dentistry   Takes daily multivitamins       Relevant Medications   Multiple Vitamins-Minerals  (ONE DAILY MULTIVITAMIN WOMEN) TABS      Meds ordered this encounter  Medications  . ibuprofen (ADVIL,MOTRIN) 800 MG tablet    Sig: Take 1 tablet (800 mg total) by mouth every 8 (eight) hours as needed.    Dispense:  40 tablet    Refill:  0    Order Specific Question:   Supervising Provider    Answer:   Quentin Angst [id number]  . benzocaine (ORAJEL) 10 % mucosal gel    Sig: Use as directed 1 application in the mouth or throat as needed for mouth pain.    Dispense:  5.3 g    Refill:  0    Order Specific Question:   Supervising Provider    Answer:   Quentin Angst [id number]  . Multiple Vitamins-Minerals (ONE DAILY MULTIVITAMIN WOMEN) TABS    Sig: Take 1 tablet by mouth daily.    Order Specific Question:   Supervising Provider    Answer:   Quentin Angst [[id number]]    Follow-up: Return if symptoms worsen or fail to improve.   Lizbeth Bark FNP

## 2017-03-23 ENCOUNTER — Encounter: Payer: Self-pay | Admitting: Surgery

## 2017-03-23 ENCOUNTER — Ambulatory Visit (INDEPENDENT_AMBULATORY_CARE_PROVIDER_SITE_OTHER): Payer: Self-pay | Admitting: Surgery

## 2017-03-23 VITALS — BP 99/63 | HR 93 | Temp 98.1°F | Ht 65.0 in | Wt 199.0 lb

## 2017-03-23 DIAGNOSIS — K439 Ventral hernia without obstruction or gangrene: Secondary | ICD-10-CM

## 2017-03-23 NOTE — Progress Notes (Signed)
Danielle Cohen is an 41 y.o. female.   Chief Complaint: Abdominal pain and bulge HPI: This a patient who was seen for abdominal pain and bulge. Is been bothering her about 3 months. She's had a cholecystectomy as well as an appendectomy in the past no other abdominal surgeries. She denies nausea or vomiting no fevers or chills  Past Medical History:  Diagnosis Date  . Chest pain 03/11/2015   Right sided chest pain. Evaluated in ED. No evidence for being cardiac. No PE>   . Cholecystitis 04/2012  . Gallstones   . 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 Right knee pain 03/11/2015  . Tear, knee, lateral meniscus 05/20/2015  . Thyroid disease    told had a thyroid cyst 11/11- needs recheck in 6 months, not done    Past Surgical History:  Procedure Laterality Date  . [place]     [place]  . CHOLECYSTECTOMY  04/06/2012   Procedure: LAPAROSCOPIC CHOLECYSTECTOMY WITH INTRAOPERATIVE CHOLANGIOGRAM;  Surgeon: Atilano InaEric M Wilson, MD,FACS;  Location: [facility] OR;  Service: General;  Laterality: N/A;  . vaginal births     6 births- 1 set of twins 6056yrs- 6months    Family History  Problem Relation Age of Onset  . Anesthesia problems Neg Hx    Social History:  reports that she has never smoked. She has never used smokeless tobacco. She reports that she does not drink alcohol or use drugs.  Allergies: No Known Allergies   (Not in a hospital admission)   Review of Systems:   Review of Systems  Unable to perform ROS: Language    Physical Exam:  Physical Exam  Constitutional: She is oriented to person, place, and time and well-developed, well-nourished, and in no distress. No distress.  HENT:  Head: Normocephalic and atraumatic.  Eyes: Pupils are equal, round, and reactive to light. Right eye exhibits no discharge. Left eye exhibits no discharge. No scleral icterus.  Neck: Normal range of motion.  Cardiovascular: Normal rate, regular rhythm and normal heart sounds.   Pulmonary/Chest: Effort normal. No  respiratory distress. She has no wheezes. She has no rales.  Abdominal: Soft. She exhibits no distension. There is no tenderness. There is no rebound and no guarding.  Infraumbilical scar from a laparoscopic cholecystectomy with underlying reducible nontender ventral hernia  Right lower quadrant scar  Musculoskeletal: Normal range of motion. She exhibits no edema.  Lymphadenopathy:    She has no cervical adenopathy.  Neurological: She is alert and oriented to person, place, and time.  Skin: Skin is warm and dry. She is not diaphoretic. No erythema.  Psychiatric: Mood and affect normal.  Vitals reviewed.   Blood pressure 99/63, pulse 93, temperature 98.1 F (36.7 C), temperature source Oral, height 5\' 5"  (1.651 m), weight 199 lb (90.3 kg).    No results found for this or any previous visit (from the past 48 hour(s)). No results found.   Assessment/Plan Ventral hernia likely from prior laparoscopic cholecystectomy. The port site was in the infraumbilical area and this is where the hernia is. It is reducible and nontender. I will discuss options and surgical intervention as well as the risks of bleeding infection recurrence mesh placement mesh infection. Patient wants to proceed with surgery but does not want to do it for several weeks or months due to her husband's travel plans. She will call [place] with her decision.  Lattie Hawichard E Cooper, MD, FACS

## 2017-03-23 NOTE — Patient Instructions (Signed)
Please give our office a call when you are ready to schedule your hernia repair.

## 2017-04-14 ENCOUNTER — Telehealth: Payer: Self-pay | Admitting: General Practice

## 2017-04-14 NOTE — Telephone Encounter (Signed)
Pt came in requesting a refill of her birth control pills to be [facility]. States that she is out. Please f/u.

## 2017-04-14 NOTE — Telephone Encounter (Signed)
No refills of any birth control will be given without office visit. Pregnancy has be ruled out before any birth control can be prescribed or refilled.

## 2017-04-18 NOTE — Telephone Encounter (Signed)
CMA call regarding  medication refill   Patient did not answer but left a VM stating the reason of the call & to call back

## 2017-04-25 ENCOUNTER — Ambulatory Visit: Payer: Self-pay | Attending: Family Medicine | Admitting: Family Medicine

## 2017-04-25 VITALS — BP 81/53 | HR 92 | Temp 98.2°F | Resp 18 | Ht 65.0 in | Wt 202.2 lb

## 2017-04-25 DIAGNOSIS — Z3041 Encounter for surveillance of contraceptive pills: Secondary | ICD-10-CM

## 2017-04-25 DIAGNOSIS — Z8639 Personal history of other endocrine, nutritional and metabolic disease: Secondary | ICD-10-CM | POA: Insufficient documentation

## 2017-04-25 DIAGNOSIS — I959 Hypotension, unspecified: Secondary | ICD-10-CM | POA: Insufficient documentation

## 2017-04-25 DIAGNOSIS — Z760 Encounter for issue of repeat prescription: Secondary | ICD-10-CM | POA: Insufficient documentation

## 2017-04-25 DIAGNOSIS — E079 Disorder of thyroid, unspecified: Secondary | ICD-10-CM

## 2017-04-25 LAB — POCT URINE PREGNANCY: Preg Test, Ur: NEGATIVE

## 2017-04-25 MED ORDER — NORETHINDRONE ACET-ETHINYL EST 1-20 MG-MCG PO TABS: 1.0000 | ORAL_TABLET | Freq: Every day | ORAL | 11 refills | Status: DC

## 2017-04-25 MED FILL — NORETHIND-ETH ESTRAD 1-0.02: 1-20 | 21 days supply | Qty: 21 | Fill #0

## 2017-04-25 NOTE — Progress Notes (Signed)
Patient is here for birth control   Patient decline PAP for today

## 2017-04-25 NOTE — Progress Notes (Signed)
Subjective:  Patient ID: Danielle Cohen, female    DOB: 03/20/1976  Age: 41 y.o. MRN: [id number]  CC: Contraception   HPI [name] presents for medication refill of oral contraceptives. She reports currently fasting for religious purposes. She declines pap examination at this time. History of soft BP's , BP low in office. Patient is asymptomatic. She reports only drinking 1 cup of tea and glass of water today. Denies any history of family history or breast or gynecological cancer. She denies any history clotting of bleeding disorders or migraine headaches. She is a non-smoker. PMH of thyroid disorder with nodules. She reports symptoms of cold intolerance and fatigue. Denies weight changes.  Outpatient Medications Prior to Visit  Medication Sig Dispense Refill  . albuterol (PROVENTIL HFA;VENTOLIN HFA) 108 (90 BASE) MCG/ACT inhaler Inhale 2 puffs into the lungs every 4 (four) hours as needed for wheezing or shortness of breath. 1 Inhaler 0  . azithromycin (ZITHROMAX) 250 MG tablet Take 1 tablet (250 mg total) by mouth daily. Take first 2 tablets together, then 1 every day until finished. 6 tablet 0  . benzocaine (ORAJEL) 10 % mucosal gel Use as directed 1 application in the mouth or throat as needed for mouth pain. 5.3 g 0  . benzonatate (TESSALON) 200 MG capsule Take 1 capsule (200 mg total) by mouth 2 (two) times daily as needed for cough. 20 capsule 1  . cephALEXin (KEFLEX) 500 MG capsule Take 1 capsule (500 mg total) by mouth 2 (two) times daily. 14 capsule 0  . cyclobenzaprine (FLEXERIL) 10 MG tablet Take 1 tablet (10 mg total) by mouth 2 (two) times daily as needed for muscle spasms. 20 tablet 0  . ibuprofen (ADVIL,MOTRIN) 800 MG tablet Take 1 tablet (800 mg total) by mouth every 8 (eight) hours as needed. 40 tablet 0  . loratadine (CLARITIN) 10 MG tablet Take 1 tablet (10 mg total) by mouth daily. 30 tablet 11  . Multiple Vitamins-Minerals (ONE DAILY MULTIVITAMIN WOMEN) TABS Take 1  tablet by mouth daily.    . naproxen (NAPROSYN) 500 MG tablet Take 1 tablet (500 mg total) by mouth 2 (two) times daily. 30 tablet 0  . norethindrone-ethinyl estradiol (MICROGESTIN,JUNEL,LOESTRIN) 1-20 MG-MCG tablet Take 1 tablet by mouth daily. 3 Package 10   No facility-administered medications prior to visit.     ROS Review of Systems  Constitutional: Positive for fatigue. Negative for unexpected weight change.  HENT: Negative.   Eyes: Negative.   Respiratory: Negative.   Cardiovascular: Negative.   Gastrointestinal: Negative.  Negative for abdominal pain.  Endocrine: Positive for cold intolerance.  Skin: Negative.   Neurological: Negative for dizziness, weakness and headaches.    Objective:  BP (!) 81/53 (BP Location: Left Arm, Patient Position: Sitting, Cuff Size: Normal)   Pulse 92   Temp 98.2 F (36.8 C) (Oral)   Resp 18   Ht 5\' 5"  (1.651 m)   Wt 202 lb 3.2 oz (91.7 kg)   SpO2 98%   BMI 33.65 kg/m   BP/Weight 04/25/2017 03/23/2017 03/14/2017  Systolic BP 81 99 93  Diastolic BP 53 63 63  Wt. (Lbs) 202.2 199 203.6  BMI 33.65 33.12 33.88     Physical Exam  Constitutional: She is oriented to person, place, and time. She appears well-developed and well-nourished.  HENT:  Head: Normocephalic and atraumatic.  Right Ear: External ear normal.  Left Ear: External ear normal.  Nose: Nose normal.  Mouth/Throat: Oropharynx is clear and moist.  Eyes: Conjunctivae are normal. Pupils are equal, round, and reactive to light.  Neck: Normal range of motion. Neck supple. No thyromegaly present.  Cardiovascular: Normal rate, regular rhythm, normal heart sounds and intact distal pulses.   Pulmonary/Chest: Effort normal and breath sounds normal.  Abdominal: Soft. Bowel sounds are normal. There is no tenderness.  Lymphadenopathy:    She has no cervical adenopathy.  Neurological: She is alert and oriented to person, place, and time.  Skin: Skin is warm and dry.  Psychiatric: She  has a normal mood and affect.  Nursing note and vitals reviewed.  Assessment & Plan:   Problem List Items Addressed This Visit      Endocrine   Thyroid disease   Relevant Orders   Thyroid Panel With TSH   PTH, Intact and Calcium (Completed)   NM Thyroid Scan/Uptake 24 Hr   CBC with Differential (Completed)    Other Visit Diagnoses    Encounter for birth control pills maintenance    -  Primary   Relevant Medications   norethindrone-ethinyl estradiol (MICROGESTIN,JUNEL,LOESTRIN) 1-20 MG-MCG tablet   History of thyroid nodule       Relevant Orders   Thyroid Panel With TSH   NM Thyroid Scan/Uptake 24 Hr   Hypotension, unspecified hypotension type            History of soft BP's and poor fluid intake due to fasting. Patient is asymptomatic.      Meds ordered this encounter  Medications  . norethindrone-ethinyl estradiol (MICROGESTIN,JUNEL,LOESTRIN) 1-20 MG-MCG tablet    Sig: Take 1 tablet by mouth daily.    Dispense:  1 Package    Refill:  11    Order Specific Question:   Supervising Provider    Answer:   Quentin Angst [id number]    Follow-up: Return in about 2 weeks (around 05/09/2017) for PAP.   Lizbeth Bark FNP

## 2017-04-25 NOTE — Patient Instructions (Signed)
Ethinyl Estradiol; Norethindrone Acetate tablets (contraception) What is this medicine? ETHINYL ESTRADIOL; NORETHINDRONE ACETATE (ETH in il es tra DYE ole; nor eth IN drone AS e tate) is an oral contraceptive. The products combine two types of female hormones, an estrogen and a progestin. They are used to prevent ovulation and pregnancy. This medicine may be used for other purposes; ask your health care provider or pharmacist if you have questions. COMMON BRAND NAME(S): Gildess, Junel 1.5/30, Junel 1/20, LARIN, Loestrin 1.5/30, Loestrin 1/20, Microgestin 1.5/30, Microgestin 1/20 What should I tell my health care provider before I take this medicine? They need to know if you have or ever had any of these conditions: -abnormal vaginal bleeding -blood vessel disease or blood clots -breast, cervical, endometrial, ovarian, liver, or uterine cancer -diabetes -gallbladder disease -heart disease or recent heart attack -high blood pressure -high cholesterol -kidney disease -liver disease -migraine headaches -stroke -systemic lupus erythematosus (SLE) -tobacco smoker -an unusual or allergic reaction to estrogens, progestins, other medicines, foods, dyes, or preservatives -pregnant or trying to get pregnant -breast-feeding How should I use this medicine? Take this medicine by mouth. To reduce nausea, this medicine may be taken with food. Follow the directions on the prescription label. Take this medicine at the same time each day and in the order directed on the package. Do not take your medicine more often than directed. Contact your pediatrician regarding the use of this medicine in children. Special care may be needed. This medicine has been used in female children who have started having menstrual periods. A patient package insert for the product will be given with each prescription and refill. Read this sheet carefully each time. The sheet may change frequently. Overdosage: If you think you  have taken too much of this medicine contact a poison control center or emergency room at once. NOTE: This medicine is only for you. Do not share this medicine with others. What if I miss a dose? If you miss a dose, refer to the patient information sheet you received with your medicine for direction. If you miss more than one pill, this medicine may not be as effective and you may need to use another form of birth control. What may interact with this medicine? Do not take this medicine with the following medication: -dasabuvir; ombitasvir; paritaprevir; ritonavir -ombitasvir; paritaprevir; ritonavir This medicine may also interact with the following medications: -acetaminophen -antibiotics or medicines for infections, especially rifampin, rifabutin, rifapentine, and griseofulvin, and possibly penicillins or tetracyclines -aprepitant -ascorbic acid (vitamin C) -atorvastatin -barbiturate medicines, such as phenobarbital -bosentan -carbamazepine -caffeine -clofibrate -cyclosporine -dantrolene -doxercalciferol -felbamate -grapefruit juice -hydrocortisone -medicines for anxiety or sleeping problems, such as diazepam or temazepam -medicines for diabetes, including pioglitazone -mineral oil -modafinil -mycophenolate -nefazodone -oxcarbazepine -phenytoin -prednisolone -ritonavir or other medicines for HIV infection or AIDS -rosuvastatin -selegiline -soy isoflavones supplements -St. John's wort -tamoxifen or raloxifene -theophylline -thyroid hormones -topiramate -warfarin This list may not describe all possible interactions. Give your health care provider a list of all the medicines, herbs, non-prescription drugs, or dietary supplements you use. Also tell them if you smoke, drink alcohol, or use illegal drugs. Some items may interact with your medicine. What should I watch for while using this medicine? Visit your doctor or health care professional for regular checks on your  progress. You will need a regular breast and pelvic exam and Pap smear while on this medicine. Use an additional method of contraception during the first cycle that you take these tablets. If you have any   reason to think you are pregnant, stop taking this medicine right away and contact your doctor or health care professional. If you are taking this medicine for hormone related problems, it may take several cycles of use to see improvement in your condition. Smoking increases the risk of getting a blood clot or having a stroke while you are taking birth control pills, especially if you are more than 43 years old. You are strongly advised not to smoke. This medicine can make your body retain fluid, making your fingers, hands, or ankles swell. Your blood pressure can go up. Contact your doctor or health care professional if you feel you are retaining fluid. This medicine can make you more sensitive to the sun. Keep out of the sun. If you cannot avoid being in the sun, wear protective clothing and use sunscreen. Do not use sun lamps or tanning beds/booths. If you wear contact lenses and notice visual changes, or if the lenses begin to feel uncomfortable, consult your eye care specialist. In some women, tenderness, swelling, or minor bleeding of the gums may occur. Notify your dentist if this happens. Brushing and flossing your teeth regularly may help limit this. See your dentist regularly and inform your dentist of the medicines you are taking. If you are going to have elective surgery, you may need to stop taking this medicine before the surgery. Consult your health care professional for advice. This medicine does not protect you against HIV infection (AIDS) or any other sexually transmitted diseases. What side effects may I notice from receiving this medicine? Side effects that you should report to your doctor or health care professional as soon as possible: -breast tissue changes or discharge -changes  in vaginal bleeding during your period or between your periods -chest pain -coughing up blood -dizziness or fainting spells -headaches or migraines -leg, arm or groin pain -severe or sudden headaches -stomach pain (severe) -sudden shortness of breath -sudden loss of coordination, especially on one side of the body -speech problems -symptoms of vaginal infection like itching, irritation or unusual discharge -tenderness in the upper abdomen -vomiting -weakness or numbness in the arms or legs, especially on one side of the body -yellowing of the eyes or skin Side effects that usually do not require medical attention (report to your doctor or health care professional if they continue or are bothersome): -breakthrough bleeding and spotting that continues beyond the 3 initial cycles of pills -breast tenderness -mood changes, anxiety, depression, frustration, anger, or emotional outbursts -increased sensitivity to sun or ultraviolet light -nausea -skin rash, acne, or brown spots on the skin -weight gain (slight) This list may not describe all possible side effects. Call your doctor for medical advice about side effects. You may report side effects to FDA at 1-800-FDA-1088. Where should I keep my medicine? Keep out of the reach of children. Store at room temperature between 15 and 30 degrees C (59 and 86 degrees F). Throw away any unused medicine after the expiration date. NOTE: This sheet is a summary. It may not cover all possible information. If you have questions about this medicine, talk to your doctor, pharmacist, or health care provider.  2018 Elsevier/Gold Standard (2016-06-28 08:02:50)  

## 2017-04-26 LAB — CBC WITH DIFFERENTIAL/PLATELET
BASOS: 0 %
Basophils Absolute: 0 10*3/uL (ref 0.0–0.2)
EOS (ABSOLUTE): 0.2 10*3/uL (ref 0.0–0.4)
EOS: 3 %
HEMOGLOBIN: 11.5 g/dL (ref 11.1–15.9)
Hematocrit: 34.8 % (ref 34.0–46.6)
IMMATURE GRANULOCYTES: 0 %
Immature Grans (Abs): 0 10*3/uL (ref 0.0–0.1)
LYMPHS ABS: 2 10*3/uL (ref 0.7–3.1)
Lymphs: 33 %
MCH: 30.9 pg (ref 26.6–33.0)
MCHC: 33 g/dL (ref 31.5–35.7)
MCV: 94 fL (ref 79–97)
MONOCYTES: 9 %
MONOS ABS: 0.6 10*3/uL (ref 0.1–0.9)
Neutrophils Absolute: 3.2 10*3/uL (ref 1.4–7.0)
Neutrophils: 55 %
Platelets: 405 10*3/uL — ABNORMAL HIGH (ref 150–379)
RBC: 3.72 x10E6/uL — ABNORMAL LOW (ref 3.77–5.28)
RDW: 13.8 % (ref 12.3–15.4)
WBC: 5.9 10*3/uL (ref 3.4–10.8)

## 2017-04-26 LAB — PTH, INTACT AND CALCIUM
CALCIUM: 9.2 mg/dL (ref 8.7–10.2)
PTH: 33 pg/mL (ref 15–65)

## 2017-05-02 LAB — SPECIMEN STATUS REPORT

## 2017-05-03 ENCOUNTER — Other Ambulatory Visit: Payer: Self-pay | Admitting: Family Medicine

## 2017-05-05 ENCOUNTER — Telehealth: Payer: Self-pay

## 2017-05-05 NOTE — Telephone Encounter (Signed)
CMA call regarding lab results   Patient Verify DOB   Patient was aware and understood  

## 2017-05-05 NOTE — Telephone Encounter (Signed)
-----   Message from Lizbeth BarkMandesia R Hairston, [place] sent at 05/03/2017  8:57 AM EDT ----- Thyroid function normal Parathyroid function is normal. Labs normal. No evidence of anemia, acute infection, or inflammation.

## 2017-05-09 ENCOUNTER — Ambulatory Visit: Payer: Self-pay | Attending: Family Medicine | Admitting: Family Medicine

## 2017-05-09 ENCOUNTER — Encounter: Payer: Self-pay | Admitting: Family Medicine

## 2017-05-09 VITALS — BP 92/63 | HR 89 | Temp 98.3°F | Resp 18 | Ht 65.0 in | Wt 202.6 lb

## 2017-05-09 DIAGNOSIS — Z79899 Other long term (current) drug therapy: Secondary | ICD-10-CM | POA: Insufficient documentation

## 2017-05-09 DIAGNOSIS — R062 Wheezing: Secondary | ICD-10-CM | POA: Insufficient documentation

## 2017-05-09 DIAGNOSIS — Z01419 Encounter for gynecological examination (general) (routine) without abnormal findings: Secondary | ICD-10-CM | POA: Insufficient documentation

## 2017-05-09 LAB — THYROID PANEL WITH TSH
Free Thyroxine Index: 2.3 (ref 1.2–4.9)
T3 Uptake Ratio: 24 % (ref 24–39)
T4 TOTAL: 9.7 ug/dL (ref 4.5–12.0)
TSH: 1.32 u[IU]/mL (ref 0.450–4.500)

## 2017-05-09 LAB — SPECIMEN STATUS REPORT

## 2017-05-09 NOTE — Progress Notes (Signed)
Subjective:  Patient ID: Danielle Cohen, female    DOB: 02/20/1976  Age: 41 y.o. MRN: [id number]  CC: Gynecologic Exam   HPI Danielle Cohen presents for well woman visit with gynecological examination. She denies any family history or breast or gynecological cancers. She denies any lumps, nipple discharge, denting or dimpling of the breast. She does not perform monthly SBE. She is a non-smoker. She declines STI testing with examination. She denies any vaginal lesions, discharge, or dysuria.    Outpatient Medications Prior to Visit  Medication Sig Dispense Refill  . albuterol (PROVENTIL HFA;VENTOLIN HFA) 108 (90 BASE) MCG/ACT inhaler Inhale 2 puffs into the lungs every 4 (four) hours as needed for wheezing or shortness of breath. 1 Inhaler 0  . azithromycin (ZITHROMAX) 250 MG tablet Take 1 tablet (250 mg total) by mouth daily. Take first 2 tablets together, then 1 every day until finished. 6 tablet 0  . benzocaine (ORAJEL) 10 % mucosal gel Use as directed 1 application in the mouth or throat as needed for mouth pain. 5.3 g 0  . benzonatate (TESSALON) 200 MG capsule Take 1 capsule (200 mg total) by mouth 2 (two) times daily as needed for cough. 20 capsule 1  . cephALEXin (KEFLEX) 500 MG capsule Take 1 capsule (500 mg total) by mouth 2 (two) times daily. 14 capsule 0  . cyclobenzaprine (FLEXERIL) 10 MG tablet Take 1 tablet (10 mg total) by mouth 2 (two) times daily as needed for muscle spasms. 20 tablet 0  . ibuprofen (ADVIL,MOTRIN) 800 MG tablet Take 1 tablet (800 mg total) by mouth every 8 (eight) hours as needed. 40 tablet 0  . loratadine (CLARITIN) 10 MG tablet Take 1 tablet (10 mg total) by mouth daily. 30 tablet 11  . Multiple Vitamins-Minerals (ONE DAILY MULTIVITAMIN WOMEN) TABS Take 1 tablet by mouth daily.    . naproxen (NAPROSYN) 500 MG tablet Take 1 tablet (500 mg total) by mouth 2 (two) times daily. 30 tablet 0  . norethindrone-ethinyl estradiol (MICROGESTIN,JUNEL,LOESTRIN) 1-20  MG-MCG tablet Take 1 tablet by mouth daily. 1 Package 11   No facility-administered medications prior to visit.     ROS Review of Systems  Constitutional: Negative.   Respiratory: Negative.   Cardiovascular: Negative.   Gastrointestinal: Negative.   Genitourinary: Negative.   Skin: Negative.     Objective:  BP 92/63 (BP Location: Left Arm, Patient Position: Sitting, Cuff Size: Normal)   Pulse 89   Temp 98.3 F (36.8 C) (Oral)   Resp 18   Ht 5\' 5"  (1.651 m)   Wt 202 lb 9.6 oz (91.9 kg)   SpO2 100%   BMI 33.71 kg/m   BP/Weight 05/09/2017 04/25/2017 03/23/2017  Systolic BP 92 81 99  Diastolic BP 63 53 63  Wt. (Lbs) 202.6 202.2 199  BMI 33.71 33.65 33.12   Physical Exam  Constitutional: She appears well-developed and well-nourished.  Cardiovascular: Normal rate, regular rhythm, normal heart sounds and intact distal pulses.   Pulmonary/Chest: Effort normal and breath sounds normal. Right breast exhibits no mass, no nipple discharge, no skin change and no tenderness. Left breast exhibits no mass, no nipple discharge, no skin change and no tenderness.  Abdominal: Soft. Bowel sounds are normal.  Genitourinary: Cervix exhibits no discharge.  Skin: Skin is warm and dry.  Nursing note and vitals reviewed.  Assessment & Plan:   Problem List Items Addressed This Visit    None    Visit Diagnoses    Well woman  exam with routine gynecological exam    -  Primary   Relevant Orders   Cytology - PAP [place] (Completed)       Follow-up: Return in about 3 years (around 05/09/2020) for Routine PAP.   Lizbeth Bark FNP

## 2017-05-09 NOTE — Patient Instructions (Signed)
Breast Self-Awareness Breast self-awareness means:  Knowing how your breasts look.  Knowing how your breasts feel.  Checking your breasts every month for changes.  Telling your doctor if you notice a change in your breasts.  Breast self-awareness allows you to notice a breast problem early while it is still small. How to do a breast self-exam One way to learn what is normal for your breasts and to check for changes is to do a breast self-exam. To do a breast self-exam: Look for Changes  1. Take off all the clothes above your waist. 2. Stand in front of a mirror in a room with good lighting. 3. Put your hands on your hips. 4. Push your hands down. 5. Look at your breasts and nipples in the mirror to see if one breast or nipple looks different than the other. Check to see if: ? The shape of one breast is different. ? The size of one breast is different. ? There are wrinkles, dips, and bumps in one breast and not the other. 6. Look at each breast for changes in your skin, such as: ? Redness. ? Scaly areas. 7. Look for changes in your nipples, such as: ? Liquid around the nipples. ? Bleeding. ? Dimpling. ? Redness. ? A change in where the nipples are. Feel for Changes 1. Lie on your back on the floor. 2. Feel each breast. To do this, follow these steps: ? Pick a breast to feel. ? Put the arm closest to that breast above your head. ? Use your other arm to feel the nipple area of your breast. Feel the area with the pads of your three middle fingers by making small circles with your fingers. For the first circle, press lightly. For the second circle, press harder. For the third circle, press even harder. ? Keep making circles with your fingers at the light, harder, and even harder pressures as you move down your breast. Stop when you feel your ribs. ? Move your fingers a little toward the center of your body. ? Start making circles with your fingers again, this time going up until  you reach your collarbone. ? Keep making up and down circles until you reach your armpit. Remember to keep using the three pressures. ? Feel the other breast in the same way. 3. Sit or stand in the shower or tub. 4. With soapy water on your skin, feel each breast the same way you did in step 2, when you were lying on the floor. Write Down What You Find  After doing the self-exam, write down:  What is normal for each breast.  Any changes you find in each breast.  When you last had your period.  How often should I check my breasts? Check your breasts every month. If you are breastfeeding, the best time to check them is after you feed your baby or after you use a breast pump. If you get periods, the best time to check your breasts is 5-7 days after your period is over. When should I see my doctor? See your doctor if you notice:  A change in shape or size of your breasts or nipples.  A change in the skin of your breast or nipples, such as red or scaly skin.  Unusual fluid coming from your nipples.  A lump or thick area that was not there before.  Pain in your breasts.  Anything that concerns you.  This information is not intended to replace advice given to   you by your health care provider. Make sure you discuss any questions you have with your health care provider. Document Released: 04/05/2008 Document Revised: 03/25/2016 Document Reviewed: 09/07/2015 Elsevier Interactive Patient Education  2018 Elsevier Inc[employer] Pap Test Why am I having this test? A pap test is sometimes called a pap smear. It is a screening test that is used to check for signs of cancer of the vagina, cervix, and uterus. The test can also identify the presence of infection or precancerous changes. Your health care provider will likely recommend you have this test done on a regular basis. This test may be done:  Every 3 years, starting at age 21.  Every 5 years, in combination with testing for the presence of  human papillomavirus (HPV).  More or less often depending on other medical conditions.  What kind of sample is taken? Using a small cotton swab, plastic spatula, or brush, your health care provider will collect a sample of cells from the surface of your cervix. Your cervix is the opening to your uterus, also called a womb. Secretions from the cervix and vagina may also be collected. How do I prepare for this test?  Be aware of where you are in your menstrual cycle. You may be asked to reschedule the test if you are menstruating on the day of the test.  You may need to reschedule if you have a known vaginal infection on the day of the test.  You may be asked to avoid douching or taking a bath the day before or the day of the test.  Some medicines can cause abnormal test results, such as digitalis and tetracycline. Talk with your health care provider before your test if you take one of these medicines. What do the results mean? Abnormal test results may indicate a number of health conditions. These may include:  Cancer. Although pap test results cannot be used to diagnose cancer of the cervix, vagina, or uterus, they may suggest the possibility of cancer. Further tests would be required to determine if cancer is present.  Sexually transmitted disease.  Fungal infection.  Parasite infection.  Herpes infection.  A condition causing or contributing to infertility.  It is your responsibility to obtain your test results. Ask the lab or department performing the test when and how you will get your results. Contact your health care provider to discuss any questions you have about your results. Talk with your health care provider to discuss your results, treatment options, and if necessary, the need for more tests. Talk with your health care provider if you have any questions about your results. This information is not intended to replace advice given to you by your health care provider. Make  sure you discuss any questions you have with your health care provider. Document Released: 01/08/2003 Document Revised: 06/23/2016 Document Reviewed: 03/11/2014 Elsevier Interactive Patient Education  2018 Elsevier Inc.  

## 2017-05-09 NOTE — Progress Notes (Signed)
Patient is here for PAP 

## 2017-05-10 LAB — CYTOLOGY - PAP
DIAGNOSIS: NEGATIVE
HPV (WINDOPATH): NOT DETECTED

## 2017-05-10 LAB — CERVICOVAGINAL ANCILLARY ONLY
BACTERIAL VAGINITIS: NEGATIVE
Candida vaginitis: POSITIVE — AB

## 2017-05-11 ENCOUNTER — Ambulatory Visit (HOSPITAL_COMMUNITY): Admission: RE | Admit: 2017-05-11 | Payer: Self-pay | Source: Ambulatory Visit

## 2017-05-12 ENCOUNTER — Encounter (HOSPITAL_COMMUNITY): Admission: RE | Admit: 2017-05-12 | Payer: Self-pay | Source: Ambulatory Visit

## 2017-05-12 ENCOUNTER — Telehealth: Payer: Self-pay

## 2017-05-12 ENCOUNTER — Other Ambulatory Visit: Payer: Self-pay | Admitting: Family Medicine

## 2017-05-12 DIAGNOSIS — B373 Candidiasis of vulva and vagina: Secondary | ICD-10-CM

## 2017-05-12 DIAGNOSIS — B3731 Acute candidiasis of vulva and vagina: Secondary | ICD-10-CM

## 2017-05-12 MED ORDER — FLUCONAZOLE 150 MG PO TABS: 150.0000 mg | ORAL_TABLET | Freq: Once | ORAL | 0 refills | Status: AC

## 2017-05-12 NOTE — Telephone Encounter (Signed)
Patient return CMA call  Patient verify DOB   Patient was aware and understood  

## 2017-05-12 NOTE — Telephone Encounter (Signed)
CMA call regarding lab results   Patient did not answer but left a VM stating the reason of the call & to call back  

## 2017-05-12 NOTE — Telephone Encounter (Signed)
-----   Message from Lizbeth BarkMandesia R Hairston, FNP sent at 05/12/2017  9:06 AM EDT ----- Pap smear showed no lesions or malignancy. Yeast was positive. You will be prescribed diflucan to treat.To reduce your risk of developing yeast don't douche, don't use scented soap or sprays, and use protection during sexual intercourse. BV was negative.

## 2017-07-27 ENCOUNTER — Ambulatory Visit: Payer: Self-pay

## 2017-08-26 MED FILL — NORETHIND-ETH ESTRAD 1-0.02: 1-20 | 21 days supply | Qty: 21 | Fill #1

## 2017-11-23 ENCOUNTER — Ambulatory Visit: Payer: Self-pay | Attending: Family Medicine

## 2017-11-24 ENCOUNTER — Ambulatory Visit: Payer: Self-pay

## 2017-11-30 ENCOUNTER — Ambulatory Visit: Payer: Self-pay

## 2018-01-13 ENCOUNTER — Ambulatory Visit (INDEPENDENT_AMBULATORY_CARE_PROVIDER_SITE_OTHER): Payer: Self-pay | Admitting: Family Medicine

## 2018-01-13 ENCOUNTER — Encounter: Payer: Self-pay | Admitting: Family Medicine

## 2018-01-13 VITALS — BP 106/52 | Ht 65.0 in | Wt 198.0 lb

## 2018-01-13 DIAGNOSIS — M25561 Pain in right knee: Secondary | ICD-10-CM

## 2018-01-13 DIAGNOSIS — M722 Plantar fascial fibromatosis: Secondary | ICD-10-CM

## 2018-01-13 MED ORDER — METHYLPREDNISOLONE ACETATE 40 MG/ML IJ SUSP
40.0000 mg | Freq: Once | INTRAMUSCULAR | Status: AC
  Administered 2018-01-13: 40 mg via INTRA_ARTICULAR

## 2018-01-13 NOTE — Progress Notes (Signed)
  Danielle Cohen - 42 y.o. female MRN [id number]  Date of birth: 02/18/1976    SUBJECTIVE:      Chief Complaint:/ HPI:  #1.  Right knee pain: Better than a year ago she had.  To be a small meniscal tear, had an injection conservative treatment and did well until about the last 2 months.  Recently she has had pain with squatting, climbing stairs.  Pain is mostly on the medial portion of the knee but also centrally located.  Worse with certain activities.  It feels stiff at times.  No locking or giving way.  No calf pain  #2.  Bilateral foot pain last several months.  Gradually worsening.  First step in the morning is painful and feels like "a fork is being stuck in there".  Typically wears sandals flat shoes.  Has pain if she does a lot of walking.  Pain is on the bottom portion of the heel, sharp, 4-6/10.  Also bothers her sometimes at night.  She is noting no numbness   ROS:     No unusual weight change..  Usual arthralgias  PERTINENT  PMH / PSH FH / / SH:  Past Medical, Surgical, Social, and Family History Reviewed & Updated in the EMR.  Pertinent findings include:  History lateral meniscal tear History of thyroid cyst  OBJECTIVE: BP (!) 106/52   Ht 5\' 5"  (1.651 m)   Wt 198 lb (89.8 kg)   BMI 32.95 kg/m   Physical Exam:  Vital signs are reviewed. GENERAL: Well-developed and in no acute distress KNEES: Symmetrical.  Neither knee has effusion.  Mild medial and lateral joint line tenderness.  Ligamentously intact.  Negative McMurray, negative Thessaly. FEET: Bilaterally symmetrical.  She has mild pes planus.  Medial foot collapse right greater than left.  Gait is normal.   SKIN: No unusual callous on  the feet.  PROCEDURE: INJECTION: Patient was given informed consent, signed copy in the chart. Appropriate time out was taken. Area prepped and draped in usual sterile fashion. Ethyl chloride was  used for local anesthesia. A 21 gauge 1 1/2 inch needle was used.. 1 cc of methylprednisolone  40 mg/ml plus 4 cc of 1% lidocaine without epinephrine was injected into the right knee using a(n) anterior medial approach.   The patient tolerated the procedure well. There were no complications. Post procedure instructions were given.   ASSESSMENT & PLAN:  #1.  Knee pain.  Probably aggravation of prior meniscal injury.  We discussed options and will try corticosteroid injection today.  Follow-up 3-4 weeks. 2.  Bilateral plantar fasciitis.  She wears.  Supportive shoes.  We discussed at length.  She wanted injection but I told her we would try conservative therapy first and I will see her back in about a month.  I would recommend she get some more supportive shoes.  She might benefit from custom molded orthotics with her current shoes selection, we would not be able to get into that.  We discussed shoes`

## 2018-01-13 NOTE — Patient Instructions (Signed)
For your feet, I have given you a handout for some exercises.  I would recommend more supportive shoes such as athletic shoes that are lace up.  I would wear them most of the time your standing or walking.  Do the exercises at least once a day for your feet.  I have given you an injection for your knee pain.  We will see how it is doing when I see you back in about a month. Today you received an injection with corticosteroid. This injection is usually done in response to pain and inflammation. There is some "numbing" medicine also in the shot so the injected area may be numb and feel really good for the next couple of hours. The numbing medicine usually wears off in 2-3 hours though, and then your pain level will be right back where it was before the injection.   The actually benefit from the steroid injection is usually noticed in 2-7 days. You may actually experience a small (as in 10%) INCREASE in pain in the first 24 hours---that is common.   Things to watch out for that you should contact us or a health care provider urgently would include: 1. Unusual (as in more than 10%) increase in pain 2. New fever > 101.5 3. New swelling or redness of the injected area.  4. Streaking of red lines around the area injected.

## 2019-05-22 ENCOUNTER — Encounter: Payer: Self-pay | Admitting: Emergency Medicine

## 2019-05-22 ENCOUNTER — Ambulatory Visit (INDEPENDENT_AMBULATORY_CARE_PROVIDER_SITE_OTHER): Payer: PRIVATE HEALTH INSURANCE | Admitting: Emergency Medicine

## 2019-05-22 ENCOUNTER — Ambulatory Visit (INDEPENDENT_AMBULATORY_CARE_PROVIDER_SITE_OTHER): Payer: PRIVATE HEALTH INSURANCE

## 2019-05-22 ENCOUNTER — Other Ambulatory Visit: Payer: Self-pay

## 2019-05-22 VITALS — BP 100/69 | HR 87 | Temp 98.5°F | Resp 16 | Ht 65.0 in | Wt 204.0 lb

## 2019-05-22 DIAGNOSIS — M79671 Pain in right foot: Secondary | ICD-10-CM

## 2019-05-22 DIAGNOSIS — M79672 Pain in left foot: Secondary | ICD-10-CM

## 2019-05-22 MED ORDER — MELOXICAM 7.5 MG PO TABS: 7.5000 mg | ORAL_TABLET | Freq: Every day | ORAL | 0 refills | Status: DC | PRN

## 2019-05-22 NOTE — Progress Notes (Signed)
Danielle Cohen 43 y.o.   Chief Complaint  Patient presents with  . Establish Care  . Foot Pain    2-3 months, pain hurts worst when pt is walking around    HISTORY OF PRESENT ILLNESS: This is a 43 y.o. female complaining of bilateral foot pain for the past 2 to 3 months.  Has had bilateral plantar fasciitis in the past but states plantar pain is gone.  Pain now localized to side of her feet.  Denies injury.  No other significant symptoms.  Pain is constant, sharp, and worse with walking.  HPI   Prior to Admission medications   Medication Sig Start Date End Date Taking? Authorizing Provider  ibuprofen (ADVIL,MOTRIN) 800 MG tablet Take 1 tablet (800 mg total) by mouth every 8 (eight) hours as needed. 03/14/17  Yes Hairston, Oren BeckmannMandesia R, FNP  Multiple Vitamins-Minerals (ONE DAILY MULTIVITAMIN WOMEN) TABS Take 1 tablet by mouth daily. 03/14/17  Yes Hairston, Oren BeckmannMandesia R, FNP  norethindrone-ethinyl estradiol (MICROGESTIN,JUNEL,LOESTRIN) 1-20 MG-MCG tablet Take 1 tablet by mouth daily. 04/25/17  Yes Hairston, Oren BeckmannMandesia R, FNP  albuterol (PROVENTIL HFA;VENTOLIN HFA) 108 (90 BASE) MCG/ACT inhaler Inhale 2 puffs into the lungs every 4 (four) hours as needed for wheezing or shortness of breath. Patient not taking: Reported on 05/22/2019 07/29/15   Cheri Fowlerose, Kayla, PA-C  azithromycin (ZITHROMAX) 250 MG tablet Take 1 tablet (250 mg total) by mouth daily. Take first 2 tablets together, then 1 every day until finished. Patient not taking: Reported on 05/22/2019 07/29/15   Cheri Fowlerose, Kayla, PA-C  benzocaine (ORAJEL) 10 % mucosal gel Use as directed 1 application in the mouth or throat as needed for mouth pain. 03/14/17   Lizbeth BarkHairston, Mandesia R, FNP  benzonatate (TESSALON) 200 MG capsule Take 1 capsule (200 mg total) by mouth 2 (two) times daily as needed for cough. Patient not taking: Reported on 05/22/2019 07/31/15   Ambrose FinlandKeck, Valerie A, NP  cephALEXin (KEFLEX) 500 MG capsule Take 1 capsule (500 mg total) by mouth 2 (two)  times daily. Patient not taking: Reported on 05/22/2019 03/01/16   Barrett, Rolm GalaStevi, PA-C  cyclobenzaprine (FLEXERIL) 10 MG tablet Take 1 tablet (10 mg total) by mouth 2 (two) times daily as needed for muscle spasms. Patient not taking: Reported on 05/22/2019 02/28/16   Dowless, Lelon MastSamantha Tripp, PA-C  loratadine (CLARITIN) 10 MG tablet Take 1 tablet (10 mg total) by mouth daily. Patient not taking: Reported on 05/22/2019 07/31/15   Ambrose FinlandKeck, Valerie A, NP  naproxen (NAPROSYN) 500 MG tablet Take 1 tablet (500 mg total) by mouth 2 (two) times daily. Patient not taking: Reported on 05/22/2019 02/28/16   Dowless, Lester KinsmanSamantha Tripp, PA-C    No Known Allergies  Patient Active Problem List   Diagnosis Date Noted  . Plantar fasciitis, bilateral 01/13/2018  . Thyroid disease     Past Medical History:  Diagnosis Date  . Chest pain 03/11/2015   Right sided chest pain. Evaluated in ED. No evidence for being cardiac. No PE>   . Cholecystitis 04/2012  . Gallstones   . 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 Right knee pain 03/11/2015  . Tear, knee, lateral meniscus 05/20/2015  . Thyroid disease    told had a thyroid cyst 11/11- needs recheck in 6 months, not done    Past Surgical History:  Procedure Laterality Date  . [place]     [place]  . CHOLECYSTECTOMY  04/06/2012   Procedure: LAPAROSCOPIC CHOLECYSTECTOMY WITH INTRAOPERATIVE CHOLANGIOGRAM;  Surgeon: Atilano InaEric M Wilson, MD,FACS;  Location: [facility] OR;  Service: General;  Laterality: N/A;  . vaginal births     6 births- 1 set of twins 23yrs- 47months    Social History   Socioeconomic History  . Marital status: Married    Spouse name: Not on file  . Number of children: Not on file  . Years of education: Not on file  . Highest education level: Not on file  Occupational History  . Not on file  Social Needs  . Financial resource strain: Not on file  . Food insecurity    Worry: Not on file    Inability: Not on file  . Transportation needs    Medical: Not on file     Non-medical: Not on file  Tobacco Use  . Smoking status: Never Smoker  . Smokeless tobacco: Never Used  Substance and Sexual Activity  . Alcohol use: No  . Drug use: No  . Sexual activity: Yes  Lifestyle  . Physical activity    Days per week: Not on file    Minutes per session: Not on file  . Stress: Not on file  Relationships  . Social Herbalist on phone: Not on file    Gets together: Not on file    Attends religious service: Not on file    Active member of club or organization: Not on file    Attends meetings of clubs or organizations: Not on file    Relationship status: Not on file  . Intimate partner violence    Fear of current or ex partner: Not on file    Emotionally abused: Not on file    Physically abused: Not on file    Forced sexual activity: Not on file  Other Topics Concern  . Not on file  Social History Narrative  . Not on file    Family History  Problem Relation Age of Onset  . Anesthesia problems Neg Hx      Review of Systems  Constitutional: Negative.  Negative for chills and fever.  HENT: Negative.   Respiratory: Negative.  Negative for cough and shortness of breath.   Gastrointestinal: Negative for abdominal pain, nausea and vomiting.  Musculoskeletal:       Bilateral foot pain  Skin: Negative.   Neurological: Negative for dizziness and headaches.   Vitals:   05/22/19 0934  BP: 100/69  Pulse: 87  Resp: 16  Temp: 98.5 F (36.9 C)  SpO2: 100%   Debility  Physical Exam Vitals signs reviewed.  Constitutional:      Appearance: Normal appearance.  HENT:     Head: Normocephalic.  Eyes:     Extraocular Movements: Extraocular movements intact.     Pupils: Pupils are equal, round, and reactive to light.  Neck:     Musculoskeletal: Normal range of motion.  Cardiovascular:     Rate and Rhythm: Normal rate and regular rhythm.  Pulmonary:     Effort: Pulmonary effort is normal.  Musculoskeletal:     Comments: Feet: No erythema  or ecchymosis.  No swelling.  Mild tenderness to proximal medial and lateral aspects. NVI  Skin:    General: Skin is warm and dry.     Capillary Refill: Capillary refill takes less than 2 seconds.  Neurological:     General: No focal deficit present.     Mental Status: She is alert and oriented to person, place, and time.  Psychiatric:        Mood and Affect: Mood normal.  Behavior: Behavior normal.    Dg Foot 2 Views Left  Result Date: 05/22/2019 CLINICAL DATA:  Bilateral foot pain EXAM: LEFT FOOT - 2 VIEW COMPARISON:  None. FINDINGS: There is no evidence of fracture or dislocation. There is no evidence of arthropathy or other focal bone abnormality. Soft tissues are unremarkable. IMPRESSION: Negative. Electronically Signed   By: Bary RichardStan  Maynard M.D.   On: 05/22/2019 10:18   Dg Foot 2 Views Right  Result Date: 05/22/2019 CLINICAL DATA:  Bilateral foot pain EXAM: RIGHT FOOT - 2 VIEW COMPARISON:  None. FINDINGS: There is no evidence of fracture or dislocation. There is no evidence of arthropathy or other focal bone abnormality. Soft tissues are unremarkable. IMPRESSION: Negative. Electronically Signed   By: Bary RichardStan  Maynard M.D.   On: 05/22/2019 10:19     ASSESSMENT & PLAN: Ronal Fearptisam was seen today for establish care and foot pain.  Diagnoses and all orders for this visit:  Bilateral foot pain -     Ambulatory referral to Podiatry -     DG Foot 2 Views Left; Future -     DG Foot 2 Views Right; Future -     meloxicam (MOBIC) 7.5 MG tablet; Take 1 tablet (7.5 mg total) by mouth daily as needed for pain.    Patient Instructions       If you have lab work done today you will be contacted with your lab results within the next 2 weeks.  If you have not heard from us then please contact us. The fastest way to get your results is to register for My Chart.   IF you received an x-ray today, you will receive an invoice from [facility]. Please contact [facility] at  [phone] with questions or concerns regarding your invoice.   IF you received labwork today, you will receive an invoice from [facility]. Please contact [facility] at [phone] with questions or concerns regarding your invoice.   Our billing staff will not be able to assist you with questions regarding bills from these companies.  You will be contacted with the lab results as soon as they are available. The fastest way to get your results is to activate your My Chart account. Instructions are located on the last page of this paperwork. If you have not heard from us regarding the results in 2 weeks, please contact this office.       Foot Pain Many things can cause foot pain. Some common causes are:  An injury.  A sprain.  Arthritis.  Blisters.  Bunions. Follow these instructions at home: Managing pain, stiffness, and swelling If directed, put ice on the painful area:  Put ice in a plastic bag.  Place a towel between your skin and the bag.  Leave the ice on for 20 minutes, 2-3 times a day.  Activity  Do not stand or walk for long periods.  Return to your normal activities as told by your health care provider. Ask your health care provider what activities are safe for you.  Do stretches to relieve foot pain and stiffness as told by your health care provider.  Do not lift anything that is heavier than 10 lb (4.5 kg), or the limit that you are told, until your health care provider says that it is safe. Lifting a lot of weight can put added pressure on your feet. Lifestyle  Wear comfortable, supportive shoes that fit you well. Do not wear high heels.  Keep your feet clean and dry. General instructions  Take over-the-counter and prescription medicines only as told by your health care provider.  Rub your foot gently.  Pay attention to any changes in your symptoms.  Keep all follow-up visits as told by your health care provider. This is important. Contact a health  care provider if:  Your pain does not get better after a few days of self-care.  Your pain gets worse.  You cannot stand on your foot. Get help right away if:  Your foot is numb or tingling.  Your foot or toes are swollen.  Your foot or toes turn white or blue.  You have warmth and redness along your foot. Summary  Common causes of foot pain are injury, sprain, arthritis, blisters or bunions.  Ice, medicines, and comfortable shoes may help foot pain.  Contact your health care provider if your pain does not get better after a few days of self-care. This information is not intended to replace advice given to you by your health care provider. Make sure you discuss any questions you have with your health care provider. Document Released: 11/14/2015 Document Revised: 08/03/2018 Document Reviewed: 08/03/2018 Elsevier Patient Education  2020 [employer] Inc[employer]      Edwina BarthMiguel Allen Egerton, MD [facility]

## 2019-05-22 NOTE — Patient Instructions (Addendum)
     If you have lab work done today you will be contacted with your lab results within the next 2 weeks.  If you have not heard from us then please contact us. The fastest way to get your results is to register for My Chart.   IF you received an x-ray today, you will receive an invoice from [facility]. Please contact [facility] at [phone] with questions or concerns regarding your invoice.   IF you received labwork today, you will receive an invoice from [facility]. Please contact [facility] at [phone] with questions or concerns regarding your invoice.   Our billing staff will not be able to assist you with questions regarding bills from these companies.  You will be contacted with the lab results as soon as they are available. The fastest way to get your results is to activate your My Chart account. Instructions are located on the last page of this paperwork. If you have not heard from us regarding the results in 2 weeks, please contact this office.      Foot Pain Many things can cause foot pain. Some common causes are:  An injury.  A sprain.  Arthritis.  Blisters.  Bunions. Follow these instructions at home: Managing pain, stiffness, and swelling If directed, put ice on the painful area:  Put ice in a plastic bag.  Place a towel between your skin and the bag.  Leave the ice on for 20 minutes, 2-3 times a day.  Activity  Do not stand or walk for long periods.  Return to your normal activities as told by your health care provider. Ask your health care provider what activities are safe for you.  Do stretches to relieve foot pain and stiffness as told by your health care provider.  Do not lift anything that is heavier than 10 lb (4.5 kg), or the limit that you are told, until your health care provider says that it is safe. Lifting a lot of weight can put added pressure on your feet. Lifestyle  Wear comfortable, supportive shoes that fit you  well. Do not wear high heels.  Keep your feet clean and dry. General instructions  Take over-the-counter and prescription medicines only as told by your health care provider.  Rub your foot gently.  Pay attention to any changes in your symptoms.  Keep all follow-up visits as told by your health care provider. This is important. Contact a health care provider if:  Your pain does not get better after a few days of self-care.  Your pain gets worse.  You cannot stand on your foot. Get help right away if:  Your foot is numb or tingling.  Your foot or toes are swollen.  Your foot or toes turn white or blue.  You have warmth and redness along your foot. Summary  Common causes of foot pain are injury, sprain, arthritis, blisters or bunions.  Ice, medicines, and comfortable shoes may help foot pain.  Contact your health care provider if your pain does not get better after a few days of self-care. This information is not intended to replace advice given to you by your health care provider. Make sure you discuss any questions you have with your health care provider. Document Released: 11/14/2015 Document Revised: 08/03/2018 Document Reviewed: 08/03/2018 Elsevier Patient Education  2020 Elsevier Inc.  

## 2019-12-17 ENCOUNTER — Encounter: Payer: Self-pay | Admitting: Emergency Medicine

## 2019-12-17 ENCOUNTER — Ambulatory Visit (INDEPENDENT_AMBULATORY_CARE_PROVIDER_SITE_OTHER): Payer: BC Managed Care – PPO | Admitting: Emergency Medicine

## 2019-12-17 ENCOUNTER — Other Ambulatory Visit: Payer: Self-pay

## 2019-12-17 VITALS — BP 98/62 | HR 97 | Temp 98.7°F | Resp 16 | Ht 65.0 in | Wt 210.0 lb

## 2019-12-17 DIAGNOSIS — K439 Ventral hernia without obstruction or gangrene: Secondary | ICD-10-CM | POA: Diagnosis not present

## 2019-12-17 NOTE — Patient Instructions (Addendum)
If you have lab work done today you will be contacted with your lab results within the next 2 weeks.  If you have not heard from [place] then please contact us. The fastest way to get your results is to register for My Chart.   IF you received an x-ray today, you will receive an invoice from [facility]. Please contact [facility] at [phone] with questions or concerns regarding your invoice.   IF you received labwork today, you will receive an invoice from [place]. Please contact [facility] at [phone] with questions or concerns regarding your invoice.   Our billing staff will not be able to assist you with questions regarding bills from these companies.  You will be contacted with the lab results as soon as they are available. The fastest way to get your results is to activate your My Chart account. Instructions are located on the last page of this paperwork. If you have not heard from [place] regarding the results in 2 weeks, please contact this office.     Ventral Hernia  A ventral hernia is a bulge of tissue from inside the abdomen that pushes through a weak area of the muscles that form the front wall of the abdomen. The tissues inside the abdomen are inside a sac (peritoneum). These tissues include the small intestine, large intestine, and the fatty tissue that covers the intestines (omentum). Sometimes, the bulge that forms a hernia contains intestines. Other hernias contain only fat. Ventral hernias do not go away without surgical treatment. There are several types of ventral hernias. You may have:  A hernia at an incision site from previous abdominal surgery (incisional hernia).  A hernia just above the belly button (epigastric hernia), or at the belly button (umbilical hernia). These types of hernias can develop from heavy lifting or straining.  A hernia that comes and goes (reducible hernia). It may be visible only when you lift or strain. This type of hernia  can be pushed back into the abdomen (reduced).  A hernia that traps abdominal tissue inside the hernia (incarcerated hernia). This type of hernia does not reduce.  A hernia that cuts off blood flow to the tissues inside the hernia (strangulated hernia). The tissues can start to die if this happens. This is a very painful bulge that cannot be reduced. A strangulated hernia is a medical emergency. What are the causes? This condition is caused by abdominal tissue putting pressure on an area of weakness in the abdominal muscles. What increases the risk? The following factors may make you more likely to develop this condition:  Being female.  Being 36 or older.  Being overweight or obese.  Having had previous abdominal surgery, especially if there was an infection after surgery.  Having had an injury to the abdominal wall.  Having had several pregnancies.  Having a buildup of fluid inside the abdomen (ascites). What are the signs or symptoms? The only symptom of a ventral hernia may be a painless bulge in the abdomen. A reducible hernia may be visible only when you strain, cough, or lift. Other symptoms may include:  Dull pain.  A feeling of pressure. Signs and symptoms of a strangulated hernia may include:  Increasing pain.  Nausea and vomiting.  Pain when pressing on the hernia.  The skin over the hernia turning red or purple.  Constipation.  Blood in the stool (feces). How is this diagnosed? This condition may be diagnosed based on:  Your symptoms.  Your  medical history.  A physical exam. You may be asked to cough or strain while standing. These actions increase the pressure inside your abdomen and force the hernia through the opening in your muscles. Your health care provider may try to reduce the hernia by pressing on it.  Imaging studies, such as an ultrasound or CT scan. How is this treated? This condition is treated with surgery. If you have a strangulated  hernia, surgery is done as soon as possible. If your hernia is small and not incarcerated, you may be asked to lose some weight before surgery. Follow these instructions at home:  Follow instructions from your health care provider about eating or drinking restrictions.  If you are overweight, your health care provider may recommend that you increase your activity level and eat a healthier diet.  Do not lift anything that is heavier than 10 lb (4.5 kg).  Return to your normal activities as told by your health care provider. Ask your health care provider what activities are safe for you. You may need to avoid activities that increase pressure on your hernia.  Take over-the-counter and prescription medicines only as told by your health care provider.  Keep all follow-up visits as told by your health care provider. This is important. Contact a health care provider if:  Your hernia gets larger.  Your hernia becomes painful. Get help right away if:  Your hernia becomes increasingly painful.  You have pain along with any of the following: ? Changes in skin color in the area of the hernia. ? Nausea. ? Vomiting. ? Fever. Summary  A ventral hernia is a bulge of tissue from inside the abdomen that pushes through a weak area of the muscles that form the front wall of the abdomen.  This condition is treated with surgery, which may be urgent depending on your hernia.  Do not lift anything that is heavier than 10 lb (4.5 kg), and follow activity instructions from your health care provider. This information is not intended to replace advice given to you by your health care provider. Make sure you discuss any questions you have with your health care provider. Document Revised: 11/30/2017 Document Reviewed: 05/09/2017 Elsevier Patient Education  [place].

## 2019-12-17 NOTE — Progress Notes (Signed)
Danielle Cohen 44 y.o.   Chief Complaint  Patient presents with  . Hernia    lower pelvic area x 1 month and per pt no period for 2 months    HISTORY OF PRESENT ILLNESS: This is a 44 y.o. female with history of periumbilical hernia for the past year complaining of progressive pain to the area over the past 4 weeks.  Denies fever.  Still able to eat and drink.  Denies nausea or vomiting.  Able to have daily bowel movements.  No other significant symptoms.  HPI   Prior to Admission medications   Medication Sig Start Date End Date Taking? Authorizing Provider  albuterol (PROVENTIL HFA;VENTOLIN HFA) 108 (90 BASE) MCG/ACT inhaler Inhale 2 puffs into the lungs every 4 (four) hours as needed for wheezing or shortness of breath. 07/29/15  Yes Gloriann Loan, PA-C  ibuprofen (ADVIL,MOTRIN) 800 MG tablet Take 1 tablet (800 mg total) by mouth every 8 (eight) hours as needed. 03/14/17  Yes Hairston, Maylon Peppers, FNP  Multiple Vitamins-Minerals (ONE DAILY MULTIVITAMIN WOMEN) TABS Take 1 tablet by mouth daily. 03/14/17  Yes Hairston, Maylon Peppers, FNP  benzocaine (ORAJEL) 10 % mucosal gel Use as directed 1 application in the mouth or throat as needed for mouth pain. Patient not taking: Reported on 12/17/2019 03/14/17   Alfonse Spruce, FNP  cyclobenzaprine (FLEXERIL) 10 MG tablet Take 1 tablet (10 mg total) by mouth 2 (two) times daily as needed for muscle spasms. Patient not taking: Reported on 12/17/2019 02/28/16   Dowless, Aldona Bar Tripp, PA-C  loratadine (CLARITIN) 10 MG tablet Take 1 tablet (10 mg total) by mouth daily. Patient not taking: Reported on 12/17/2019 07/31/15   Lance Bosch, NP  meloxicam (MOBIC) 7.5 MG tablet Take 1 tablet (7.5 mg total) by mouth daily as needed for pain. Patient not taking: Reported on 12/17/2019 05/22/19   Horald Pollen, MD  naproxen (NAPROSYN) 500 MG tablet Take 1 tablet (500 mg total) by mouth 2 (two) times daily. Patient not taking: Reported on 12/17/2019  02/28/16   Dowless, Aldona Bar Tripp, PA-C  norethindrone-ethinyl estradiol (MICROGESTIN,JUNEL,LOESTRIN) 1-20 MG-MCG tablet Take 1 tablet by mouth daily. Patient not taking: Reported on 12/17/2019 04/25/17   Alfonse Spruce, FNP    Not on File  Patient Active Problem List   Diagnosis Date Noted  . Thyroid disease     Past Medical History:  Diagnosis Date  . Chest pain 03/11/2015   Right sided chest pain. Evaluated in ED. No evidence for being cardiac. No PE>   . Cholecystitis 04/2012  . Gallstones   . 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 Right knee pain 03/11/2015  . Tear, knee, lateral meniscus 05/20/2015  . Thyroid disease    told had a thyroid cyst 11/11- needs recheck in 6 months, not done    Past Surgical History:  Procedure Laterality Date  . APPENDECTOMY     in [place]  . CHOLECYSTECTOMY  04/06/2012   Procedure: LAPAROSCOPIC CHOLECYSTECTOMY WITH INTRAOPERATIVE CHOLANGIOGRAM;  Surgeon: Gayland Curry, MD,FACS;  Location: [facility];  Service: General;  Laterality: N/A;  . vaginal births     6 births- 1 set of twins 14yrs- 8months    Social History   Socioeconomic History  . Marital status: Married    Spouse name: Not on file  . Number of children: Not on file  . Years of education: Not on file  . Highest education level: Not on file  Occupational History  . Not on file  Tobacco Use  . Smoking status: Never Smoker  . Smokeless tobacco: Never Used  Substance and Sexual Activity  . Alcohol use: No  . Drug use: No  . Sexual activity: Yes  Other Topics Concern  . Not on file  Social History Narrative  . Not on file   Social Determinants of Health   Financial Resource Strain:   . Difficulty of Paying Living Expenses: Not on file  Food Insecurity:   . Worried About Programme researcher, broadcasting/film/video in the Last Year: Not on file  . Ran Out of Food in the Last Year: Not on file  Transportation Needs:   . Lack of Transportation (Medical): Not on file  . Lack of Transportation (Non-Medical): Not  on file  Physical Activity:   . Days of Exercise per Week: Not on file  . Minutes of Exercise per Session: Not on file  Stress:   . Feeling of Stress : Not on file  Social Connections:   . Frequency of Communication with Friends and Family: Not on file  . Frequency of Social Gatherings with Friends and Family: Not on file  . Attends Religious Services: Not on file  . Active Member of Clubs or Organizations: Not on file  . Attends Banker Meetings: Not on file  . Marital Status: Not on file  Intimate Partner Violence:   . Fear of Current or Ex-Partner: Not on file  . Emotionally Abused: Not on file  . Physically Abused: Not on file  . Sexually Abused: Not on file    Family History  Problem Relation Age of Onset  . Anesthesia problems Neg Hx      Review of Systems  Constitutional: Negative.  Negative for chills and fever.  HENT: Negative for congestion and sore throat.   Respiratory: Negative.  Negative for cough and shortness of breath.   Cardiovascular: Negative.  Negative for chest pain and palpitations.  Gastrointestinal: Negative for abdominal pain, blood in stool, constipation, diarrhea, melena, nausea and vomiting.  Genitourinary: Negative.  Negative for dysuria and hematuria.  Skin: Negative.  Negative for rash.  Neurological: Negative.  Negative for dizziness and headaches.  All other systems reviewed and are negative.   Vitals:   12/17/19 1120  BP: 98/62  Pulse: 97  Resp: 16  Temp: 98.7 F (37.1 C)  SpO2: 99%    Physical Exam Vitals reviewed.  Constitutional:      Appearance: Normal appearance.  HENT:     Head: Normocephalic.  Eyes:     Extraocular Movements: Extraocular movements intact.     Pupils: Pupils are equal, round, and reactive to light.  Cardiovascular:     Rate and Rhythm: Normal rate and regular rhythm.     Heart sounds: Normal heart sounds.  Pulmonary:     Effort: Pulmonary effort is normal.     Breath sounds: Normal  breath sounds.  Abdominal:     General: Bowel sounds are normal.     Palpations: Abdomen is soft.     Hernia: A hernia is present. Hernia is present in the ventral area.  Musculoskeletal:     Cervical back: Normal range of motion.  Neurological:     Mental Status: She is alert.      ASSESSMENT & PLAN: Danielle Cohen was seen today for hernia.  Diagnoses and all orders for this visit:  Ventral hernia without obstruction or gangrene -     Cancel: Ambulatory referral to General Surgery -  Ambulatory referral to General Surgery   ED precautions given.   Patient Instructions       If you have lab work done today you will be contacted with your lab results within the next 2 weeks.  If you have not heard from [place] then please contact us. The fastest way to get your results is to register for My Chart.   IF you received an x-ray today, you will receive an invoice from [facility]. Please contact [facility] at [phone] with questions or concerns regarding your invoice.   IF you received labwork today, you will receive an invoice from [place] [facility]. Please contact [facility] at [phone] with questions or concerns regarding your invoice.   Our billing staff will not be able to assist you with questions regarding bills from these companies.  You will be contacted with the lab results as soon as they are available. The fastest way to get your results is to activate your My Chart account. Instructions are located on the last page of this paperwork. If you have not heard from [place] regarding the results in 2 weeks, please contact this office.     Ventral Hernia  A ventral hernia is a bulge of tissue from inside the abdomen that pushes through a weak area of the muscles that form the front wall of the abdomen. The tissues inside the abdomen are inside a sac (peritoneum). These tissues include the small intestine, large intestine, and the fatty tissue that covers the  intestines (omentum). Sometimes, the bulge that forms a hernia contains intestines. Other hernias contain only fat. Ventral hernias do not go away without surgical treatment. There are several types of ventral hernias. You may have:  A hernia at an incision site from previous abdominal surgery (incisional hernia).  A hernia just above the belly button (epigastric hernia), or at the belly button (umbilical hernia). These types of hernias can develop from heavy lifting or straining.  A hernia that comes and goes (reducible hernia). It may be visible only when you lift or strain. This type of hernia can be pushed back into the abdomen (reduced).  A hernia that traps abdominal tissue inside the hernia (incarcerated hernia). This type of hernia does not reduce.  A hernia that cuts off blood flow to the tissues inside the hernia (strangulated hernia). The tissues can start to die if this happens. This is a very painful bulge that cannot be reduced. A strangulated hernia is a medical emergency. What are the causes? This condition is caused by abdominal tissue putting pressure on an area of weakness in the abdominal muscles. What increases the risk? The following factors may make you more likely to develop this condition:  Being female.  Being 77 or older.  Being overweight or obese.  Having had previous abdominal surgery, especially if there was an infection after surgery.  Having had an injury to the abdominal wall.  Having had several pregnancies.  Having a buildup of fluid inside the abdomen (ascites). What are the signs or symptoms? The only symptom of a ventral hernia may be a painless bulge in the abdomen. A reducible hernia may be visible only when you strain, cough, or lift. Other symptoms may include:  Dull pain.  A feeling of pressure. Signs and symptoms of a strangulated hernia may include:  Increasing pain.  Nausea and vomiting.  Pain when pressing on the hernia.  The  skin over the hernia turning red or purple.  Constipation.  Blood in the  stool (feces). How is this diagnosed? This condition may be diagnosed based on:  Your symptoms.  Your medical history.  A physical exam. You may be asked to cough or strain while standing. These actions increase the pressure inside your abdomen and force the hernia through the opening in your muscles. Your health care provider may try to reduce the hernia by pressing on it.  Imaging studies, such as an ultrasound or CT scan. How is this treated? This condition is treated with surgery. If you have a strangulated hernia, surgery is done as soon as possible. If your hernia is small and not incarcerated, you may be asked to lose some weight before surgery. Follow these instructions at home:  Follow instructions from your health care provider about eating or drinking restrictions.  If you are overweight, your health care provider may recommend that you increase your activity level and eat a healthier diet.  Do not lift anything that is heavier than 10 lb (4.5 kg).  Return to your normal activities as told by your health care provider. Ask your health care provider what activities are safe for you. You may need to avoid activities that increase pressure on your hernia.  Take over-the-counter and prescription medicines only as told by your health care provider.  Keep all follow-up visits as told by your health care provider. This is important. Contact a health care provider if:  Your hernia gets larger.  Your hernia becomes painful. Get help right away if:  Your hernia becomes increasingly painful.  You have pain along with any of the following: ? Changes in skin color in the area of the hernia. ? Nausea. ? Vomiting. ? Fever. Summary  A ventral hernia is a bulge of tissue from inside the abdomen that pushes through a weak area of the muscles that form the front wall of the abdomen.  This condition is  treated with surgery, which may be urgent depending on your hernia.  Do not lift anything that is heavier than 10 lb (4.5 kg), and follow activity instructions from your health care provider. This information is not intended to replace advice given to you by your health care provider. Make sure you discuss any questions you have with your health care provider. Document Revised: 11/30/2017 Document Reviewed: 05/09/2017 Elsevier Patient Education  2020 Elsevier Inc.      Edwina Barth, MD [facility]

## 2020-01-15 ENCOUNTER — Other Ambulatory Visit: Payer: Self-pay | Admitting: Surgery

## 2020-02-22 ENCOUNTER — Other Ambulatory Visit: Payer: Self-pay

## 2020-02-22 ENCOUNTER — Other Ambulatory Visit (HOSPITAL_COMMUNITY)
Admission: RE | Admit: 2020-02-22 | Discharge: 2020-02-22 | Disposition: A | Discharge status: Home / Self Care | Payer: BC Managed Care – PPO | Source: Ambulatory Visit | Attending: Surgery | Admitting: Surgery

## 2020-02-22 DIAGNOSIS — Z20822 Contact with and (suspected) exposure to covid-19: Secondary | ICD-10-CM | POA: Diagnosis not present

## 2020-02-22 DIAGNOSIS — Z01812 Encounter for preprocedural laboratory examination: Secondary | ICD-10-CM | POA: Insufficient documentation

## 2020-02-22 LAB — SARS CORONAVIRUS 2 (TAT 6-24 HRS): SARS Coronavirus 2: NEGATIVE

## 2020-02-22 NOTE — Pre-Procedure Instructions (Signed)
    Danielle Cohen  02/22/2020    Your procedure is scheduled on Tuesday, April 27.  Report to [facility], Main Entrance or Entrance "A" at 5:30 AM   Call this number if you have problems the morning of surgery: For any other questions, please call [phone], Monday - Friday 8 AM - 4 PM.     Remember:  Do not eat  after midnight.  You may drink clear liquids until 5:00 AM .  Clear liquids allowed are: Water, Juice (non-citric and without pulp), Carbonated beverages, Clear Tea, Black Coffee only, Plain Jell-O only, Gatorade and Plain Popsicles only.        Drink the Pre- Surgery Ensure between 4:00 AM and 5 AM, nothing else to drink after 5:00 AM    Take these medicines the morning of surgery with A SIP OF WATER :NONE    Shower, wear clean clothes, brush teeth.  Do not wear any lotion, powders or colognes or makeup and do not bring any valuables; the hospital is not responsible for these.

## 2020-02-22 NOTE — Progress Notes (Signed)
Mrs Danielle Cohen denies chest pain or shortness of breath. Patient had Covid test today and is quarantine with her family. Mrs Danielle Cohen speaks English fairly well, we used an Optometrist for a few questions and instructions.  I told patient that I did not want to insult her, but with medical procedures we  want to know that  information is understood, patient agreed. I gave Mrs Danielle Cohen instructions for ERAS diet, patient aid that her husband can come to the hospital on Monday to pick Pre- Surgery Ensure. I will send written instructions with beverage.

## 2020-02-25 NOTE — Anesthesia Preprocedure Evaluation (Addendum)
Anesthesia Evaluation  Patient identified by MRN, date of birth, ID band Patient awake    Reviewed: Allergy & Precautions, NPO status , Patient's Chart, lab work & pertinent test results  History of Anesthesia Complications Negative for: history of anesthetic complications  Airway Mallampati: II  TM Distance: >3 FB Neck ROM: Full    Dental  (+) Dental Advisory Given, Teeth Intact   Pulmonary neg pulmonary ROS,    Pulmonary exam normal        Cardiovascular negative cardio ROS Normal cardiovascular exam     Neuro/Psych negative neurological ROS  negative psych ROS   GI/Hepatic negative GI ROS, Neg liver ROS,   Endo/Other   Thyroid cyst Obesity   Renal/GU negative Renal ROS     Musculoskeletal negative musculoskeletal ROS (+)   Abdominal   Peds  Hematology negative hematology ROS (+)   Anesthesia Other Findings Covid neg 4/23  Reproductive/Obstetrics                            Anesthesia Physical Anesthesia Plan  ASA: II  Anesthesia Plan: General   Post-op Pain Management:    Induction: Intravenous  PONV Risk Score and Plan: 4 or greater and Treatment may vary due to age or medical condition, Ondansetron, Midazolam and Dexamethasone  Airway Management Planned: Oral ETT  Additional Equipment: None  Intra-op Plan:   Post-operative Plan: Extubation in OR  Informed Consent: I have reviewed the patients History and Physical, chart, labs and discussed the procedure including the risks, benefits and alternatives for the proposed anesthesia with the patient or authorized representative who has indicated his/her understanding and acceptance.     Dental advisory given  Plan Discussed with: CRNA and Anesthesiologist  Anesthesia Plan Comments:        Anesthesia Quick Evaluation

## 2020-02-25 NOTE — H&P (Signed)
Danielle Cohen  Location: [facility] Patient #: [id number] DOB: 11/14/1975 Married / Language: English / Race: Asian Female   History of Present Illness   The patient is a 44 year old female who presents with an incisional hernia. Chief complaint: Incisional hernia  History: This is a 44 year old female referred by Dr. Mitchel Honour for evaluation of an incisional hernia. She is noticeable to below her umbilicus for several years. She has had a previous laparoscopic cholecystectomy in 2013. She reports that the hernia is getting larger and causing increasing pain daily. She has had some nausea but no emesis. She is moving her bowels well. She is otherwise healthy without complaints. The pain is moderate in intensity.   Past Surgical History No pertinent past surgical history   Diagnostic Studies History) Mammogram  never  Allergies No Known Drug Allergies   Medication History Ibuprofen (200MG  Tablet, Oral) Active. Medications Reconciled  Pregnancy / Birth History Length (months) of breastfeeding  12-24 Maternal age  102-20  Other Problems  Ventral Hernia Repair     Review of Systems General Not Present- Appetite Loss, Chills, Fatigue, Fever, Night Sweats, Weight Gain and Weight Loss. Skin Not Present- Change in Wart/Mole, Dryness, Hives, Jaundice, New Lesions, Non-Healing Wounds, Rash and Ulcer. HEENT Not Present- Earache, Hearing Loss, Hoarseness, Nose Bleed, Oral Ulcers, Ringing in the Ears, Seasonal Allergies, Sinus Pain, Sore Throat, Visual Disturbances, Wears glasses/contact lenses and Yellow Eyes. Respiratory Not Present- Bloody sputum, Chronic Cough, Difficulty Breathing, Snoring and Wheezing. Breast Not Present- Breast Mass, Breast Pain, Nipple Discharge and Skin Changes. Cardiovascular Not Present- Chest Pain, Difficulty Breathing Lying Down, Leg Cramps, Palpitations, Rapid Heart Rate, Shortness of Breath and Swelling of  Extremities. Gastrointestinal Not Present- Abdominal Pain, Bloating, Bloody Stool, Change in Bowel Habits, Chronic diarrhea, Constipation, Difficulty Swallowing, Excessive gas, Gets full quickly at meals, Hemorrhoids, Indigestion, Nausea, Rectal Pain and Vomiting. Female Genitourinary Not Present- Frequency, Nocturia, Painful Urination, Pelvic Pain and Urgency. Musculoskeletal Not Present- Back Pain, Joint Pain, Joint Stiffness, Muscle Pain, Muscle Weakness and Swelling of Extremities. Neurological Not Present- Decreased Memory, Fainting, Headaches, Numbness, Seizures, Tingling, Tremor, Trouble walking and Weakness. Psychiatric Not Present- Anxiety, Bipolar, Change in Sleep Pattern, Depression, Fearful and Frequent crying. Endocrine Not Present- Cold Intolerance, Excessive Hunger, Hair Changes, Heat Intolerance, Hot flashes and New Diabetes. Hematology Not Present- Blood Thinners, Easy Bruising, Excessive bleeding, Gland problems, HIV and Persistent Infections.  Vitals  Weight: 212 lb Height: 65in Body Surface Area: 2.03 m Body Mass Index: 35.28 kg/m  Pulse: 122 (Regular)  BP: 118/78(Sitting, Left Arm, Standard    Physical Exam  The physical exam findings are as follows: Note: On exam, she appears well  Her abdomen is soft. There is an easily reducible, fairly large hernia below the umbilicus at her old cholecystectomy incision. It is mildly tender. There are no other hernias.    Assessment & Plan   INCISIONAL HERNIA, WITHOUT OBSTRUCTION OR GANGRENE  (K43.2)  Impression: I had a discussion with the patient and her husband regarding the diagnosis of an incisional hernia. I explained abdominal wall anatomy. I explained hernias and the reasons hernias need to be repaired. As she is fairly symptomatic, and the hernia is quite large, repair is recommended and she wants to proceed with surgery. I then discussed both the laparoscopic and open techniques. We discussed the risks of  bleeding, infection, injury to surrounding structures, use of mesh, hernia recurrence, cardiopulmonary issues, DVT, the need for further procedures, postoperative recovery, etc.  At this point, she wishes to proceed with an open incisional hernia repair with mesh which will be scheduled.

## 2020-02-26 ENCOUNTER — Ambulatory Visit (HOSPITAL_COMMUNITY): Payer: BC Managed Care – PPO | Admitting: Anesthesiology

## 2020-02-26 ENCOUNTER — Other Ambulatory Visit: Payer: Self-pay

## 2020-02-26 ENCOUNTER — Encounter (HOSPITAL_COMMUNITY): Payer: Self-pay | Admitting: Surgery

## 2020-02-26 ENCOUNTER — Observation Stay (HOSPITAL_COMMUNITY)
Admission: RE | Admit: 2020-02-26 | Discharge: 2020-02-27 | Disposition: A | Discharge status: Home / Self Care | Payer: BC Managed Care – PPO | Attending: Surgery | Admitting: Surgery

## 2020-02-26 ENCOUNTER — Encounter (HOSPITAL_COMMUNITY)
Admission: RE | Disposition: A | Discharge status: Home / Self Care | Payer: Self-pay | Source: Home / Self Care | Attending: Surgery

## 2020-02-26 DIAGNOSIS — E669 Obesity, unspecified: Secondary | ICD-10-CM | POA: Diagnosis not present

## 2020-02-26 DIAGNOSIS — K432 Incisional hernia without obstruction or gangrene: Principal | ICD-10-CM | POA: Diagnosis present

## 2020-02-26 DIAGNOSIS — Z6833 Body mass index (BMI) 33.0-33.9, adult: Secondary | ICD-10-CM | POA: Insufficient documentation

## 2020-02-26 HISTORY — PX: INCISIONAL HERNIA REPAIR: SHX193

## 2020-02-26 LAB — CBC
HCT: 40.7 % (ref 36.0–46.0)
Hemoglobin: 12.9 g/dL (ref 12.0–15.0)
MCH: 30.6 pg (ref 26.0–34.0)
MCHC: 31.7 g/dL (ref 30.0–36.0)
MCV: 96.4 fL (ref 80.0–100.0)
Platelets: 383 10*3/uL (ref 150–400)
RBC: 4.22 MIL/uL (ref 3.87–5.11)
RDW: 12.5 % (ref 11.5–15.5)
WBC: 6.4 10*3/uL (ref 4.0–10.5)
nRBC: 0 % (ref 0.0–0.2)

## 2020-02-26 SURGERY — REPAIR, HERNIA, INCISIONAL
Anesthesia: General

## 2020-02-26 MED ORDER — OXYCODONE HCL 5 MG PO TABS
ORAL_TABLET | ORAL | Status: AC
  Filled 2020-02-26: qty 1

## 2020-02-26 MED ORDER — LIDOCAINE 2% (20 MG/ML) 5 ML SYRINGE
INTRAMUSCULAR | Status: AC
  Filled 2020-02-26: qty 5

## 2020-02-26 MED ORDER — LIDOCAINE 2% (20 MG/ML) 5 ML SYRINGE
INTRAMUSCULAR | Status: DC | PRN
  Administered 2020-02-26: 60 mg via INTRAVENOUS

## 2020-02-26 MED ORDER — TRAMADOL HCL 50 MG PO TABS: 50.0000 mg | ORAL_TABLET | Freq: Four times a day (QID) | ORAL | Status: DC | PRN

## 2020-02-26 MED ORDER — FENTANYL CITRATE (PF) 100 MCG/2ML IJ SOLN
25.0000 ug | INTRAMUSCULAR | Status: DC | PRN
  Administered 2020-02-26: 25 ug via INTRAVENOUS
  Administered 2020-02-26 (×3): 50 ug via INTRAVENOUS

## 2020-02-26 MED ORDER — CHLORHEXIDINE GLUCONATE CLOTH 2 % EX PADS: 6.0000 | MEDICATED_PAD | Freq: Once | CUTANEOUS | Status: DC

## 2020-02-26 MED ORDER — GABAPENTIN 300 MG PO CAPS: 300.0000 mg | ORAL_CAPSULE | ORAL | Status: DC

## 2020-02-26 MED ORDER — ONDANSETRON 4 MG PO TBDP: 4.0000 mg | ORAL_TABLET | Freq: Four times a day (QID) | ORAL | Status: DC | PRN

## 2020-02-26 MED ORDER — DIPHENHYDRAMINE HCL 50 MG/ML IJ SOLN: 25.0000 mg | Freq: Four times a day (QID) | INTRAMUSCULAR | Status: DC | PRN

## 2020-02-26 MED ORDER — CELECOXIB 200 MG PO CAPS
400.0000 mg | ORAL_CAPSULE | ORAL | Status: AC
  Administered 2020-02-26: 400 mg via ORAL
  Filled 2020-02-26: qty 2

## 2020-02-26 MED ORDER — FENTANYL CITRATE (PF) 100 MCG/2ML IJ SOLN
INTRAMUSCULAR | Status: AC
  Filled 2020-02-26: qty 2

## 2020-02-26 MED ORDER — DIPHENHYDRAMINE HCL 25 MG PO CAPS: 25.0000 mg | ORAL_CAPSULE | Freq: Four times a day (QID) | ORAL | Status: DC | PRN

## 2020-02-26 MED ORDER — ONDANSETRON HCL 4 MG/2ML IJ SOLN
INTRAMUSCULAR | Status: DC | PRN
  Administered 2020-02-26: 4 mg via INTRAVENOUS

## 2020-02-26 MED ORDER — PROPOFOL 10 MG/ML IV BOLUS
INTRAVENOUS | Status: DC | PRN
  Administered 2020-02-26: 50 mg via INTRAVENOUS
  Administered 2020-02-26: 150 mg via INTRAVENOUS

## 2020-02-26 MED ORDER — ONDANSETRON HCL 4 MG/2ML IJ SOLN: 4.0000 mg | Freq: Four times a day (QID) | INTRAMUSCULAR | Status: DC | PRN

## 2020-02-26 MED ORDER — DEXAMETHASONE SODIUM PHOSPHATE 10 MG/ML IJ SOLN
INTRAMUSCULAR | Status: AC
  Filled 2020-02-26: qty 1

## 2020-02-26 MED ORDER — METHOCARBAMOL 500 MG PO TABS
ORAL_TABLET | ORAL | Status: AC
  Filled 2020-02-26: qty 1

## 2020-02-26 MED ORDER — MIDAZOLAM HCL 2 MG/2ML IJ SOLN
INTRAMUSCULAR | Status: AC
  Filled 2020-02-26: qty 2

## 2020-02-26 MED ORDER — OXYCODONE HCL 5 MG PO TABS
5.0000 mg | ORAL_TABLET | ORAL | Status: DC | PRN
  Administered 2020-02-26: 5 mg via ORAL
  Administered 2020-02-26 – 2020-02-27 (×2): 10 mg via ORAL
  Filled 2020-02-26 (×3): qty 2

## 2020-02-26 MED ORDER — HYDROMORPHONE HCL 1 MG/ML IJ SOLN
1.0000 mg | INTRAMUSCULAR | Status: DC | PRN
  Administered 2020-02-26: 1 mg via INTRAVENOUS

## 2020-02-26 MED ORDER — BUPIVACAINE HCL (PF) 0.25 % IJ SOLN
INTRAMUSCULAR | Status: AC
  Filled 2020-02-26: qty 30

## 2020-02-26 MED ORDER — DEXMEDETOMIDINE HCL 200 MCG/2ML IV SOLN
INTRAVENOUS | Status: DC | PRN
  Administered 2020-02-26: 8 ug via INTRAVENOUS

## 2020-02-26 MED ORDER — ONDANSETRON HCL 4 MG/2ML IJ SOLN
INTRAMUSCULAR | Status: AC
  Filled 2020-02-26: qty 2

## 2020-02-26 MED ORDER — OXYCODONE HCL 5 MG PO TABS
5.0000 mg | ORAL_TABLET | Freq: Once | ORAL | Status: AC | PRN
  Administered 2020-02-26: 5 mg via ORAL

## 2020-02-26 MED ORDER — ACETAMINOPHEN 500 MG PO TABS
1000.0000 mg | ORAL_TABLET | ORAL | Status: AC
  Administered 2020-02-26: 1000 mg via ORAL
  Filled 2020-02-26: qty 2

## 2020-02-26 MED ORDER — CEFAZOLIN SODIUM-DEXTROSE 2-4 GM/100ML-% IV SOLN
2.0000 g | INTRAVENOUS | Status: AC
  Administered 2020-02-26: 2 g via INTRAVENOUS
  Filled 2020-02-26: qty 100

## 2020-02-26 MED ORDER — ENOXAPARIN SODIUM 40 MG/0.4ML ~~LOC~~ SOLN
40.0000 mg | SUBCUTANEOUS | Status: DC
  Administered 2020-02-27: 40 mg via SUBCUTANEOUS
  Filled 2020-02-26: qty 0.4

## 2020-02-26 MED ORDER — PROMETHAZINE HCL 25 MG/ML IJ SOLN: 6.2500 mg | INTRAMUSCULAR | Status: DC | PRN

## 2020-02-26 MED ORDER — BUPIVACAINE-EPINEPHRINE 0.25% -1:200000 IJ SOLN
INTRAMUSCULAR | Status: DC | PRN
  Administered 2020-02-26: 20 mL

## 2020-02-26 MED ORDER — FENTANYL CITRATE (PF) 250 MCG/5ML IJ SOLN
INTRAMUSCULAR | Status: AC
  Filled 2020-02-26: qty 5

## 2020-02-26 MED ORDER — LACTATED RINGERS IV SOLN: INTRAVENOUS | Status: DC | PRN

## 2020-02-26 MED ORDER — OXYCODONE HCL 5 MG/5ML PO SOLN: 5.0000 mg | Freq: Once | ORAL | Status: AC | PRN

## 2020-02-26 MED ORDER — DEXAMETHASONE SODIUM PHOSPHATE 10 MG/ML IJ SOLN
INTRAMUSCULAR | Status: DC | PRN
  Administered 2020-02-26: 4 mg via INTRAVENOUS

## 2020-02-26 MED ORDER — PROPOFOL 10 MG/ML IV BOLUS
INTRAVENOUS | Status: AC
  Filled 2020-02-26: qty 20

## 2020-02-26 MED ORDER — 0.9 % SODIUM CHLORIDE (POUR BTL) OPTIME
TOPICAL | Status: DC | PRN
  Administered 2020-02-26: 09:00:00 1000 mL

## 2020-02-26 MED ORDER — HYDROMORPHONE HCL 1 MG/ML IJ SOLN
INTRAMUSCULAR | Status: AC
  Filled 2020-02-26: qty 1

## 2020-02-26 MED ORDER — ROCURONIUM BROMIDE 10 MG/ML (PF) SYRINGE
PREFILLED_SYRINGE | INTRAVENOUS | Status: DC | PRN
  Administered 2020-02-26: 40 mg via INTRAVENOUS

## 2020-02-26 MED ORDER — FENTANYL CITRATE (PF) 250 MCG/5ML IJ SOLN
INTRAMUSCULAR | Status: DC | PRN
  Administered 2020-02-26: 25 ug via INTRAVENOUS

## 2020-02-26 MED ORDER — METHOCARBAMOL 500 MG PO TABS
500.0000 mg | ORAL_TABLET | Freq: Four times a day (QID) | ORAL | Status: DC | PRN
  Administered 2020-02-26: 500 mg via ORAL
  Filled 2020-02-26: qty 1

## 2020-02-26 MED ORDER — ENSURE PRE-SURGERY PO LIQD: 296.0000 mL | Freq: Once | ORAL | Status: DC

## 2020-02-26 MED ORDER — GABAPENTIN 300 MG PO CAPS
300.0000 mg | ORAL_CAPSULE | Freq: Two times a day (BID) | ORAL | Status: DC
  Administered 2020-02-26 – 2020-02-27 (×2): 300 mg via ORAL
  Filled 2020-02-26 (×2): qty 1

## 2020-02-26 MED ORDER — SUGAMMADEX SODIUM 200 MG/2ML IV SOLN
INTRAVENOUS | Status: DC | PRN
  Administered 2020-02-26: 200 mg via INTRAVENOUS

## 2020-02-26 MED ORDER — MIDAZOLAM HCL 5 MG/5ML IJ SOLN
INTRAMUSCULAR | Status: DC | PRN
  Administered 2020-02-26: 2 mg via INTRAVENOUS

## 2020-02-26 MED ORDER — CELECOXIB 200 MG PO CAPS
200.0000 mg | ORAL_CAPSULE | Freq: Two times a day (BID) | ORAL | Status: DC
  Administered 2020-02-26 – 2020-02-27 (×2): 200 mg via ORAL
  Filled 2020-02-26 (×2): qty 1

## 2020-02-26 MED ORDER — POTASSIUM CHLORIDE IN NACL 20-0.9 MEQ/L-% IV SOLN
INTRAVENOUS | Status: DC
  Filled 2020-02-26: qty 1000

## 2020-02-26 SURGICAL SUPPLY — 41 items
ADH SKN CLS APL DERMABOND .7 (GAUZE/BANDAGES/DRESSINGS)
ADH SKN CLS LQ APL DERMABOND (GAUZE/BANDAGES/DRESSINGS) ×1
APL PRP STRL LF DISP 70% ISPRP (MISCELLANEOUS) ×1
BLADE CLIPPER SURG (BLADE) IMPLANT
CANISTER SUCT 3000ML PPV (MISCELLANEOUS) ×3 IMPLANT
CHLORAPREP W/TINT 26 (MISCELLANEOUS) ×3 IMPLANT
COVER SURGICAL LIGHT HANDLE (MISCELLANEOUS) ×3 IMPLANT
COVER WAND RF STERILE (DRAPES) ×3 IMPLANT
DERMABOND ADHESIVE PROPEN (GAUZE/BANDAGES/DRESSINGS) ×2
DERMABOND ADVANCED (GAUZE/BANDAGES/DRESSINGS)
DERMABOND ADVANCED .7 DNX12 (GAUZE/BANDAGES/DRESSINGS) IMPLANT
DERMABOND ADVANCED .7 DNX6 (GAUZE/BANDAGES/DRESSINGS) IMPLANT
DRAPE LAPAROSCOPIC ABDOMINAL (DRAPES) ×3 IMPLANT
DRSG TEGADERM 4X4.75 (GAUZE/BANDAGES/DRESSINGS) IMPLANT
ELECT REM PT RETURN 9FT ADLT (ELECTROSURGICAL) ×3
ELECTRODE REM PT RTRN 9FT ADLT (ELECTROSURGICAL) ×1 IMPLANT
GAUZE SPONGE 4X4 12PLY STRL (GAUZE/BANDAGES/DRESSINGS) IMPLANT
GLOVE SURG SIGNA 7.5 PF LTX (GLOVE) ×3 IMPLANT
GOWN STRL REUS W/ TWL LRG LVL3 (GOWN DISPOSABLE) ×1 IMPLANT
GOWN STRL REUS W/ TWL XL LVL3 (GOWN DISPOSABLE) ×1 IMPLANT
GOWN STRL REUS W/TWL LRG LVL3 (GOWN DISPOSABLE) ×3
GOWN STRL REUS W/TWL XL LVL3 (GOWN DISPOSABLE) ×3
KIT BASIN OR (CUSTOM PROCEDURE TRAY) ×3 IMPLANT
KIT TURNOVER KIT B (KITS) ×3 IMPLANT
MESH VENTRALEX ST 8CM LRG (Mesh General) ×2 IMPLANT
NDL HYPO 25GX1X1/2 BEV (NEEDLE) ×1 IMPLANT
NEEDLE HYPO 25GX1X1/2 BEV (NEEDLE) ×3 IMPLANT
NS IRRIG 1000ML POUR BTL (IV SOLUTION) ×3 IMPLANT
PACK GENERAL/GYN (CUSTOM PROCEDURE TRAY) ×3 IMPLANT
PAD ARMBOARD 7.5X6 YLW CONV (MISCELLANEOUS) ×3 IMPLANT
PENCIL SMOKE EVACUATOR (MISCELLANEOUS) ×3 IMPLANT
STAPLER VISISTAT 35W (STAPLE) IMPLANT
SUT MNCRL AB 4-0 PS2 18 (SUTURE) ×2 IMPLANT
SUT NOVA NAB DX-16 0-1 5-0 T12 (SUTURE) IMPLANT
SUT PROLENE 1 CT (SUTURE) IMPLANT
SUT VIC AB 3-0 SH 27 (SUTURE)
SUT VIC AB 3-0 SH 27XBRD (SUTURE) IMPLANT
SYR CONTROL 10ML LL (SYRINGE) IMPLANT
TOWEL GREEN STERILE (TOWEL DISPOSABLE) ×3 IMPLANT
TOWEL GREEN STERILE FF (TOWEL DISPOSABLE) ×3 IMPLANT
TRAY FOLEY MTR SLVR 14FR STAT (SET/KITS/TRAYS/PACK) IMPLANT

## 2020-02-26 NOTE — Interval H&P Note (Signed)
History and Physical Interval Note:no change in H and P  02/26/2020 7:46 AM  Danielle Cohen  has presented today for surgery, with the diagnosis of INCISIONAL HERNIA.  The various methods of treatment have been discussed with the patient and family. After consideration of risks, benefits and other options for treatment, the patient has consented to  Procedure(s): HERNIA REPAIR INCISIONAL WITH MESH (N/A) as a surgical intervention.  The patient's history has been reviewed, patient examined, no change in status, stable for surgery.  I have reviewed the patient's chart and labs.  Questions were answered to the patient's satisfaction.     Danielle Cohen

## 2020-02-26 NOTE — Anesthesia Postprocedure Evaluation (Signed)
Anesthesia Post Note  Patient: Danielle Cohen  Procedure(s) Performed: HERNIA REPAIR INCISIONAL WITH MESH (N/A )     Patient location during evaluation: PACU Anesthesia Type: General Level of consciousness: awake and alert Pain management: pain level controlled Vital Signs Assessment: post-procedure vital signs reviewed and stable Respiratory status: spontaneous breathing, nonlabored ventilation and respiratory function stable Cardiovascular status: blood pressure returned to baseline and stable Postop Assessment: no apparent nausea or vomiting Anesthetic complications: no    Last Vitals:  Vitals:   02/26/20 0950 02/26/20 1005  BP: 98/61 (!) 91/59  Pulse: 66 67  Resp: 15 14  Temp:    SpO2: 100% 95%    Last Pain:  Vitals:   02/26/20 0955  TempSrc:   PainSc: 6                  Beryle Lathe

## 2020-02-26 NOTE — Op Note (Signed)
HERNIA REPAIR INCISIONAL WITH MESH  Procedure Note  Danielle Cohen 02/26/2020   Pre-op Diagnosis: INCISIONAL HERNIA     Post-op Diagnosis: same  Procedure(s): INCISIONAL HERNIA REPAIR WITH MESH ( 8 CM ROUND VENTRAL PATCH)  Surgeon(s): Abigail Miyamoto, MD  Anesthesia: General  Staff:  Circulator: Charolotte Capuchin, RN Scrub Person: Micheline Chapman; Teschner, Mindy K, CST  Estimated Blood Loss: Minimal               Findings: The patient was found to have a fascial defect at the previous incision below the umbilicus that measured approximately 4 cm in size.  It was repaired with an 8 cm round ventral Prolene patch from [facility]  Procedure: The patient was brought to the operating room and identifies correct patient.  She was placed upon the operating table general anesthesia was induced.  Her abdomen was then prepped and draped in the usual sterile fashion.  I anesthetized the skin over the palpable fascial defect below the umbilicus with Marcaine.  I then made a midline incision with a scalpel.  I then dissected down through the subcutaneous tissue to the hernia sac.  All contents have been reduced.  I excised the hernia sac in its entirety.  There were no attachments of bowel to the sac or underlying peritoneum.  The fascial defect was approximately 4 cm in size.  I brought an 8 cm round ventral Prolene patch from [facility] onto the field.  I placed it through the fascial opening and then pulled it up against the peritoneum with the ties.  The mesh was then sutured in place circumferentially with #1 Novafil sutures.  The ties were then cut.  I then closed the fascia over the top of the mesh with figure-of-eight and interrupted #1 Novafil sutures.  Again wide coverage of the fascial defect appeared to be achieved.  I anesthetized the fascia further with Marcaine.  Hemostasis appeared to be achieved.  I then closed the subcutaneous tissue with interrupted 3-0 Vicryl sutures and closed the skin with a  running 4-0 Monocryl.  Dermabond was then applied.  The patient tolerated the procedure well.  All the counts were correct at the end of the procedure.  The patient was then extubated in the operating room and taken in a stable condition to the recovery room.          Abigail Miyamoto   Date: 02/26/2020  Time: 8:42 AM

## 2020-02-26 NOTE — Anesthesia Procedure Notes (Signed)
Procedure Name: Intubation Performed by: Zailey Audia H, CRNA Pre-anesthesia Checklist: Patient identified, Emergency Drugs available, Suction available and Patient being monitored Patient Re-evaluated:Patient Re-evaluated prior to induction Oxygen Delivery Method: Circle System Utilized Preoxygenation: Pre-oxygenation with 100% oxygen Induction Type: IV induction Ventilation: Mask ventilation without difficulty Laryngoscope Size: Miller and 2 Grade View: Grade I Tube type: Oral Tube size: 7.0 mm Number of attempts: 1 Airway Equipment and Method: Stylet and Oral airway Placement Confirmation: ETT inserted through vocal cords under direct vision,  positive ETCO2 and breath sounds checked- equal and bilateral Secured at: 21 cm Tube secured with: Tape Dental Injury: Teeth and Oropharynx as per pre-operative assessment        

## 2020-02-26 NOTE — Transfer of Care (Signed)
Immediate Anesthesia Transfer of Care Note  Patient: Danielle Cohen  Procedure(s) Performed: HERNIA REPAIR INCISIONAL WITH MESH (N/A )  Patient Location: PACU  Anesthesia Type:General  Level of Consciousness: awake  Airway & Oxygen Therapy: Patient Spontanous Breathing  Post-op Assessment: Report given to RN and Post -op Vital signs reviewed and stable  Post vital signs: Reviewed and stable  Last Vitals:  Vitals Value Taken Time  BP 113/68 02/26/20 0850  Temp    Pulse 79 02/26/20 0851  Resp 23 02/26/20 0851  SpO2 100 % 02/26/20 0851  Vitals shown include unvalidated device data.  Last Pain:  Vitals:   02/26/20 0627  TempSrc: Oral         Complications: No apparent anesthesia complications

## 2020-02-27 DIAGNOSIS — K432 Incisional hernia without obstruction or gangrene: Secondary | ICD-10-CM | POA: Diagnosis not present

## 2020-02-27 MED ORDER — OXYCODONE HCL 5 MG PO TABS: 5.0000 mg | ORAL_TABLET | Freq: Four times a day (QID) | ORAL | 0 refills | Status: AC | PRN

## 2020-02-27 NOTE — Progress Notes (Signed)
Patient ID: Danielle Cohen, female   DOB: 03/11/1976, 44 y.o.   MRN: [id number]   Pod#1  Doing well Pain controlled Abdomen soft, incision clean  Plan: Discharge home today

## 2020-02-27 NOTE — Discharge Summary (Signed)
Physician Discharge Summary  Patient ID: ALAJIA SCHMELZER MRN: [id number] DOB/AGE: 1976/03/05 44 y.o.  Admit date: 02/26/2020 Discharge date: 02/27/2020  Admission Diagnoses:  Discharge Diagnoses:  Active Problems:   Incisional hernia   Discharged Condition: good  Hospital Course: uneventful post op recovery.  Patient doing well and discharged home POD#1  Consults: None  Significant Diagnostic Studies:   Treatments: surgery: incisional hernia repair with mesh  Discharge Exam: Blood pressure 104/67, pulse 67, temperature 98.4 F (36.9 C), temperature source Oral, resp. rate 16, height 5\' 5"  (1.651 m), weight 92.1 kg, last menstrual period 02/22/2020, SpO2 99 %. General appearance: alert, cooperative and no distress Resp: clear to auscultation bilaterally Cardio: regular rate and rhythm, S1, S2 normal, no murmur, click, rub or gallop Incision/Wound:abdomen soft, incision clean  Disposition: Discharge disposition: 01-Home or Self Care        Allergies as of 02/27/2020   No Known Allergies     Medication List    TAKE these medications   oxyCODONE 5 MG immediate release tablet Commonly known as: Oxy IR/ROXICODONE Take 1 tablet (5 mg total) by mouth every 6 (six) hours as needed for moderate pain or severe pain.      Follow-up Information    02/29/2020, MD. Schedule an appointment as soon as possible for a visit in 3 week(s).   Specialty: General Surgery Contact information: [address] [place] [phone]           Signed: [phone] 02/27/2020, 8:47 AM

## 2020-02-27 NOTE — Plan of Care (Signed)
  Problem: Education: Goal: Knowledge of General Education information will improve Description: Including pain rating scale, medication(s)/side effects and non-pharmacologic comfort measures Outcome: Progressing   Problem: Health Behavior/Discharge Planning: Goal: Ability to manage health-related needs will improve Outcome: Progressing   Problem: Health Behavior/Discharge Planning: Goal: Ability to manage health-related needs will improve Outcome: Progressing   

## 2020-02-27 NOTE — Discharge Instructions (Signed)
[facility] _______[facility]  UMBILICAL OR INGUINAL HERNIA REPAIR: POST OP INSTRUCTIONS  Always review your discharge instruction sheet given to you by the facility where your surgery was performed. IF YOU HAVE DISABILITY OR FAMILY LEAVE FORMS, YOU MUST BRING THEM TO THE OFFICE FOR PROCESSING.   DO NOT GIVE THEM TO YOUR DOCTOR.  1. A  prescription for pain medication may be given to you upon discharge.  Take your pain medication as prescribed, if needed.  If narcotic pain medicine is not needed, then you may take acetaminophen (Tylenol) or ibuprofen (Advil) as needed. 2. Take your usually prescribed medications unless otherwise directed. If you need a refill on your pain medication, please contact your pharmacy.  They will contact our office to request authorization. Prescriptions will not be filled after 5 pm or on week-ends. 3. You should follow a light diet the first 24 hours after arrival home, such as soup and crackers, etc.  Be sure to include lots of fluids daily.  Resume your normal diet the day after surgery. 4.Most patients will experience some swelling and bruising around the umbilicus or in the groin and scrotum.  Ice packs and reclining will help.  Swelling and bruising can take several days to resolve.  6. It is common to experience some constipation if taking pain medication after surgery.  Increasing fluid intake and taking a stool softener (such as Colace) will usually help or prevent this problem from occurring.  A mild laxative (Milk of Magnesia or Miralax) should be taken according to package directions if there are no bowel movements after 48 hours. 7. Unless discharge instructions indicate otherwise, you may remove your bandages 24-48 hours after surgery, and you may shower at that time.  You may have steri-strips (small skin tapes) in place directly over the incision.  These strips should be left on the skin for 7-10 days.  If your surgeon used skin glue on the  incision, you may shower in 24 hours.  The glue will flake off over the next 2-3 weeks.  Any sutures or staples will be removed at the office during your follow-up visit. 8. ACTIVITIES:  You may resume regular (light) daily activities beginning the next day--such as daily self-care, walking, climbing stairs--gradually increasing activities as tolerated.  You may have sexual intercourse when it is comfortable.  Refrain from any heavy lifting or straining until approved by your doctor.  a.You may drive when you are no longer taking prescription pain medication, you can comfortably wear a seatbelt, and you can safely maneuver your car and apply brakes. b.RETURN TO WORK:   _____________________________________________  9.You should see your doctor in the office for a follow-up appointment approximately 2-3 weeks after your surgery.  Make sure that you call for this appointment within a day or two after you arrive home to insure a convenient appointment time. 10.OTHER INSTRUCTIONS: _IT IS OK TO TAKE A SHOWER STARTING TODAY.  NO SOAKING IN THE TUB FOR ONE WEEK ICE PACK, TYLENOL, AND IBUPROFEN ALSO FOR PAIN NO LIFTING MORE THAN 15 TO 20 POUNDS FOR 6 WEEKS________________________    _____________________________________  WHEN TO CALL YOUR DOCTOR: 1. Fever over 101.0 2. Inability to urinate 3. Nausea and/or vomiting 4. Extreme swelling or bruising 5. Continued bleeding from incision. 6. Increased pain, redness, or drainage from the incision  The clinic staff is available to answer your questions during regular business hours.  Please don't hesitate to call and ask to speak to one of the  nurses for clinical concerns.  If you have a medical emergency, go to the nearest emergency room or call 911.  A surgeon from [facility] is always on call at the hospital   [address] [place], [place], [place]  [address] ?  [address], [place], [place]   [address] [phone] ?  [phone] ? FAX [phone] Web site: [email or web address]

## 2020-02-27 NOTE — Progress Notes (Signed)
Patient discharged to home. Verbalizes understanding of all discharge instructions including incision care, discharge medications, and follow up MD visits. All questions answered at this time. Awaiting ride.

## 2020-11-02 ENCOUNTER — Encounter (HOSPITAL_COMMUNITY): Payer: Self-pay

## 2020-11-02 ENCOUNTER — Other Ambulatory Visit: Payer: Self-pay

## 2020-11-02 ENCOUNTER — Ambulatory Visit (HOSPITAL_COMMUNITY)
Admission: EM | Admit: 2020-11-02 | Discharge: 2020-11-02 | Disposition: A | Discharge status: Home / Self Care | Payer: BC Managed Care – PPO | Attending: Family Medicine | Admitting: Family Medicine

## 2020-11-02 DIAGNOSIS — Z3202 Encounter for pregnancy test, result negative: Secondary | ICD-10-CM | POA: Diagnosis not present

## 2020-11-02 DIAGNOSIS — J069 Acute upper respiratory infection, unspecified: Secondary | ICD-10-CM | POA: Insufficient documentation

## 2020-11-02 DIAGNOSIS — U071 COVID-19: Secondary | ICD-10-CM | POA: Diagnosis not present

## 2020-11-02 LAB — RESP PANEL BY RT-PCR (FLU A&B, COVID) ARPGX2
Influenza A by PCR: NEGATIVE
Influenza B by PCR: NEGATIVE
SARS Coronavirus 2 by RT PCR: POSITIVE — AB

## 2020-11-02 LAB — POC URINE PREG, ED: Preg Test, Ur: NEGATIVE

## 2020-11-02 NOTE — ED Provider Notes (Signed)
[facility]    CSN: [id number] Arrival date & time: 11/02/20  1726      History   Chief Complaint Chief Complaint  Patient presents with  . Headache  . Fever  . Sore Throat    HPI Danielle Cohen is a 45 y.o. female Presenting for URI symptoms for 1 days. History of chest pain, cholecystitis, right meniscus tear, thyroid disease. Presenting with headache, subjective fever, sore throat. Cold and flu medication providing much relief and she states she's feeling almost totally better now. Endorses irregular period requesting pregnancy test and referral to gyn. Denies n/v/d, shortness of breath, chest pain, cough, congestion, facial pain, teeth pain,  loss of taste/smell, swollen lymph nodes, ear pain.  Denies hematuria, dysuria, frequency, urgency, back pain, n/v/d/abd pain, fevers/chills, abdnormal vaginal discharge.  Denies chest pain, shortness of breath, confusion, high fevers.  Not vaccinated for covid-19.   HPI  Past Medical History:  Diagnosis Date  . Chest pain 03/11/2015   Right sided chest pain. Evaluated in ED. No evidence for being cardiac. No PE>   . Cholecystitis 04/2012  . Gallstones   . 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 Right knee pain 03/11/2015  . Tear, knee, lateral meniscus 05/20/2015  . Thyroid disease    told had a thyroid cyst 11/11- needs recheck in 6 months, not done    Patient Active Problem List   Diagnosis Date Noted  . Incisional hernia 02/26/2020  . Thyroid disease     Past Surgical History:  Procedure Laterality Date  . [place]ENDECTOMY     in [place]  . CHOLECYSTECTOMY  04/06/2012   Procedure: LAPAROSCOPIC CHOLECYSTECTOMY WITH INTRAOPERATIVE CHOLANGIOGRAM;  Surgeon: Atilano Ina, MD,FACS;  Location: [facility] OR;  Service: General;  Laterality: N/A;  . INCISIONAL HERNIA REPAIR  02/26/2020  . INCISIONAL HERNIA REPAIR N/A 02/26/2020   Procedure: HERNIA REPAIR INCISIONAL WITH MESH;  Surgeon: Abigail Miyamoto, MD;  Location: [facility] OR;  Service: General;   Laterality: N/A;  . vaginal births     6 births- 1 set of twins 73yrs- 75months    OB History    Gravida  5   Para  5   Term  5   Preterm  0   AB  0   Living  6     SAB  0   IAB  0   Ectopic  0   Multiple  1   Live Births  5            Home Medications    Prior to Admission medications   Medication Sig Start Date End Date Taking? Authorizing Provider  oxyCODONE (OXY IR/ROXICODONE) 5 MG immediate release tablet Take 1 tablet (5 mg total) by mouth every 6 (six) hours as needed for moderate pain or severe pain. 02/27/20   Abigail Miyamoto, MD    Family History Family History  Problem Relation Age of Onset  . Anesthesia problems Neg Hx     Social History Social History   Tobacco Use  . Smoking status: Never Smoker  . Smokeless tobacco: Never Used  Vaping Use  . Vaping Use: Never used  Substance Use Topics  . Alcohol use: No  . Drug use: No     Allergies   Patient has no known allergies.   Review of Systems Review of Systems  Constitutional: Positive for chills and fever. Negative for appetite change.  HENT: Positive for sore throat. Negative for congestion, ear pain, rhinorrhea, sinus pressure and sinus pain.  Eyes: Negative for redness and visual disturbance.  Respiratory: Negative for cough, chest tightness, shortness of breath and wheezing.   Cardiovascular: Negative for chest pain and palpitations.  Gastrointestinal: Negative for abdominal pain, constipation, diarrhea, nausea and vomiting.  Genitourinary: Negative for dysuria, frequency and urgency.  Musculoskeletal: Negative for myalgias.  Neurological: Positive for headaches. Negative for dizziness and weakness.  Psychiatric/Behavioral: Negative for confusion.  All other systems reviewed and are negative.    Physical Exam Triage Vital Signs ED Triage Vitals  Enc Vitals Group     BP 11/02/20 1807 121/79     Pulse Rate 11/02/20 1807 76     Resp 11/02/20 1807 20     Temp 11/02/20  1807 98.3 F (36.8 C)     Temp Source 11/02/20 1807 Oral     SpO2 11/02/20 1807 97 %     Weight --      Height --      Head Circumference --      Peak Flow --      Pain Score 11/02/20 1805 4     Pain Loc --      Pain Edu? --      Excl. in GC? --    No data found.  Updated Vital Signs BP 121/79 (BP Location: Right Arm)   Pulse 76   Temp 98.3 F (36.8 C) (Oral)   Resp 20   LMP  (LMP Unknown) Comment: LMP 4 months ago  SpO2 97%   Visual Acuity Right Eye Distance:   Left Eye Distance:   Bilateral Distance:    Right Eye Near:   Left Eye Near:    Bilateral Near:     Physical Exam Vitals reviewed.  Constitutional:      General: She is not in acute distress.    Appearance: Normal appearance. She is not ill-appearing.  HENT:     Head: Normocephalic and atraumatic.     Right Ear: Hearing, tympanic membrane, ear canal and external ear normal. No swelling or tenderness. There is no impacted cerumen. No mastoid tenderness. Tympanic membrane is not perforated, erythematous, retracted or bulging.     Left Ear: Hearing, tympanic membrane, ear canal and external ear normal. No swelling or tenderness. There is no impacted cerumen. No mastoid tenderness. Tympanic membrane is not perforated, erythematous, retracted or bulging.     Nose:     Right Sinus: No maxillary sinus tenderness or frontal sinus tenderness.     Left Sinus: No maxillary sinus tenderness or frontal sinus tenderness.     Mouth/Throat:     Mouth: Mucous membranes are moist.     Pharynx: Uvula midline. No oropharyngeal exudate or posterior oropharyngeal erythema.     Tonsils: No tonsillar exudate.  Cardiovascular:     Rate and Rhythm: Normal rate and regular rhythm.     Heart sounds: Normal heart sounds.  Pulmonary:     Breath sounds: Normal breath sounds and air entry. No wheezing, rhonchi or rales.  Chest:     Chest wall: No tenderness.  Abdominal:     General: Abdomen is flat. Bowel sounds are normal.      Tenderness: There is no abdominal tenderness. There is no guarding or rebound.  Lymphadenopathy:     Cervical: No cervical adenopathy.  Neurological:     General: No focal deficit present.     Mental Status: She is alert and oriented to person, place, and time.  Psychiatric:        Attention and  Perception: Attention and perception normal.        Mood and Affect: Mood and affect normal.        Behavior: Behavior normal. Behavior is cooperative.        Thought Content: Thought content normal.        Judgment: Judgment normal.      UC Treatments / Results  Labs (all labs ordered are listed, but only abnormal results are displayed) Labs Reviewed  RESP PANEL BY RT-PCR (FLU A&B, COVID) [id number]  POC URINE PREG, ED    EKG   Radiology No results found.  Procedures Procedures (including critical care time)  Medications Ordered in UC Medications - No data to display  Initial Impression / Assessment and Plan / UC Course  I have reviewed the triage vital signs and the nursing notes.  Pertinent labs & imaging results that were available during my care of the patient were reviewed by me and considered in my medical decision making (see chart for details).     Covid and influenza tests sent today. Patient is not vaccinated for covid-19. Isolation precautions per CDC guidelines until negative result. Symptomatic relief with OTC Mucinex, Nyquil, etc. Return precautions- new/worsening fevers/chills, shortness of breath, chest pain, abd pain, etc.   Urine pregnancy negative today.-make an appointment with OB-GYN to discuss your irregular periods (information provided below at patient request) -continue your over-the-counter cold and flu medications  Final Clinical Impressions(s) / UC Diagnoses   Final diagnoses:  Acute upper respiratory infection     Discharge Instructions     -make an appointment with OB-GYN to discuss your periods (information provided below) -continue your  over-the-counter cold and flu medications  We are currently awaiting result of your PCR covid-19 test. Please isolate at home while awaiting these results. If your test is positive for Covid-19, continue to isolate at home for a total of 10 days from symptom onset. Treat your symptoms at home with OTC remedies like tylenol/ibuprofen, mucinex, nyquil, etc. Seek medical attention if you develop high fevers, chest pain, shortness of breath, ear pain, facial pain, etc. Make sure to get up and move around every 2-3 hours while convalescing to help prevent blood clots. Drink plenty of fluids, and rest as much as possible.     ED Prescriptions    None     PDMP not reviewed this encounter.   Hazel Sams, PA-C 11/02/20 1952

## 2020-11-02 NOTE — ED Triage Notes (Addendum)
Pt in with c/o ST, subjective fever, and headache that started last night  Pt has taken cold and flu medicine with some relief  Denies N/V, diarrhea or other uri sxs  Pt also requesting pregnancy test, states she hasn't had period in 4 months

## 2020-11-02 NOTE — Discharge Instructions (Addendum)
-  make an appointment with OB-GYN to discuss your periods (information provided below) -continue your over-the-counter cold and flu medications  We are currently awaiting result of your PCR covid-19 test. Please isolate at home while awaiting these results. If your test is positive for Covid-19, continue to isolate at home for a total of 10 days from symptom onset. Treat your symptoms at home with OTC remedies like tylenol/ibuprofen, mucinex, nyquil, etc. Seek medical attention if you develop high fevers, chest pain, shortness of breath, ear pain, facial pain, etc. Make sure to get up and move around every 2-3 hours while convalescing to help prevent blood clots. Drink plenty of fluids, and rest as much as possible.

## 2021-03-06 ENCOUNTER — Ambulatory Visit (INDEPENDENT_AMBULATORY_CARE_PROVIDER_SITE_OTHER): Payer: BC Managed Care – PPO | Admitting: Family Medicine

## 2021-03-06 ENCOUNTER — Other Ambulatory Visit: Payer: Self-pay

## 2021-03-06 VITALS — BP 118/82 | Ht 65.0 in | Wt 194.0 lb

## 2021-03-06 DIAGNOSIS — M1710 Unilateral primary osteoarthritis, unspecified knee: Secondary | ICD-10-CM

## 2021-03-06 MED ORDER — METHYLPREDNISOLONE ACETATE 40 MG/ML IJ SUSP
40.0000 mg | Freq: Once | INTRAMUSCULAR | Status: AC
  Administered 2021-03-06: 40 mg via INTRA_ARTICULAR

## 2021-03-06 NOTE — Progress Notes (Signed)
[facility]: Attending Note: I have reviewed the chart, discussed wit the Sports Medicine Fellow. I agree with assessment and treatment plan as detailed in the Fellow's note.  

## 2021-03-06 NOTE — Progress Notes (Signed)
Office Visit Note   Patient: Danielle Cohen           Date of Birth: 10-Nov-1975           MRN: [id number] Visit Date: 03/06/2021 Requested by: Georgina Quint, MD [address] [place],  [place] [address] PCP: Georgina Quint, MD  Subjective: CC: Bilateral knee pain  HPI: 45 year old female presenting to clinic today with 2 months of bilateral knee pain.  Patient denies any trauma, stating that this pain is insidiously worsened with prolonged standing at work.  She says that she is on her feet for at least 12 hours a day with her job, and she will notice that her knees ache and swell at the end of her shifts.  Several years ago, she was told that she had arthritis in her right knee, and received a steroid injection at that time.  She has been asymptomatic ever since then.  She is curious to know if she can have knee injections again today, given her significant improvement in the past.  She feels as though both of her knees are swollen, and stiff in the morning.  No fevers or chills.  No trauma.               ROS:   All other systems were reviewed and are negative.  Objective: Vital Signs: BP 118/82   Ht 5\' 5"  (1.651 m)   Wt 194 lb (88 kg)   BMI 32.28 kg/m  No flowsheet data found.   No flowsheet data found.  Physical Exam:  General:  Alert and oriented, in no acute distress. Pulm:  Breathing unlabored. Psy:  Normal mood, congruent affect. Skin: Bilateral knees without bruises, rashes, or erythema. Overlying skin intact.  Bilateral knee exam:  General: Normal gait Standing exam: No varus or valgus deformity of the knee.   Seated Exam:  Mild patellar crepitus bilaterally.  Full range of motion in flexion extension with both knees.  Palpation: Endorses tenderness to palpation primarily over the medial joint lines bilaterally.  Some tenderness over lateral joint lines.  No significant pain with patellar compression or along patellar tendon.   Supine exam: Small  bilateral effusion, normal patellar mobility.   Ligamentous Exam:  No pain or laxity with anterior/posterior drawer.  No obvious Sag.  No pain or laxity with varus/valgus stress across the knee.   Meniscus:  McMurray with some pain however no deep clicking.   Strength: Hip flexion (L1), Hip Aduction (L2), Knee Extension (L3) are 5/5 Bilaterally Foot Inversion (L4), Dorsiflexion (L5), and Eversion (S1) 5/5 Bilaterally  Sensation: Intact to light touch medial and lateral aspects of lower extremities, and lateral, dorsal, and medial aspects of foot.    Imaging: No results found.  Assessment & Plan: 45 year old female with past medical history of right knee arthritis, presenting to clinic today with concerns of insidious onset of bilateral knee aching and stiffness as well as swelling.  Examination is concerning for recurrent arthritic symptoms.  Given her improvement with steroid injections in the past, patient elected to try this again today on both knees.  Risks and benefits of steroid injections discussed, patient opted to proceed. -Corticosteroid injections performed as described below, which patient tolerated very well.  She endorsed immediate improvement of her symptoms during anesthetic phase. -Aftercare and return precautions were discussed. -Discussed simple quadricep exercises to help strengthen and stabilize the knee. -If patient fails to notice adequate improvement within 3 to 4 weeks of steroid injections,  she is to return to clinic for reevaluation. -Patient had no further questions or concerns today.  Arabic interpreter present throughout visit to assist with communication.     Procedures: Bilateral knee cortisone Injection:  Risks and benefits of procedure discussed, Patient opted to proceed.  Written consent obtained.  Timeout performed.  Skin prepped with alcohol. Ethyl Chloride was used for topical analgesia.  Left knee was injected with 3cc 1% Lidocaine without  epinephrine combined with 40 mg of methylprednisolone via the suprapatellar approach using a 25G, 1.5in needle.  Procedure was then repeated on the left knee.  Patient tolerated the injection well with no immediate complications. Aftercare instructions were discussed, and patient was given strict return precautions.

## 2021-03-06 NOTE — Patient Instructions (Signed)
You had an injection today. Things to be aware of after injection are listed below:   . You may experience no significant improvement or even a slight worsening in your symptoms during the first 24 to 48 hours. After that we expect your symptoms to improve gradually over the next 2 weeks for the medicine to have its maximal effect. You should continue to have improvement out to 6 weeks after your injection. . We recommend icing the site of the injection for 20 minutes 1-2 times the day of your injection and as needed for pain over the following several days. . You may shower but no swimming, tub bath or Jacuzzi for 24 hours. . If your bandage falls off this does not need to be replaced. It is appropriate to remove the bandage after 4 hours. . You may resume light activities as tolerated.  It can take several weeks to see improvements following injection. If after 2 weeks you are continuing to have worsening symptoms, please call our office to discuss what the next appropriate actions should be including the potential for a return office visit or other diagnostic testing.  POSSIBLE PROCEDURE SIDE EFFECTS: The side effects of the injection are usually minimal, self-limited, and usually will resolve on their own. Common side effects that can occur over the first several days following injection include:  . Increased numbness or tingling . Worsening pain, stiffness, or slight weakness . Swelling or bruising at the injection site  If you are concerned, please feel free to contact the office with questions.   Please call our office immediately if you experience any of the following symptoms over the next 2 weeks as these can be signs of infection:  . Fever greater than 100.5F . Significant swelling at the injection site . Significant redness or drainage from the injection site  

## 2021-11-06 ENCOUNTER — Ambulatory Visit: Payer: BC Managed Care – PPO | Admitting: Family Medicine

## 2022-03-19 ENCOUNTER — Ambulatory Visit (INDEPENDENT_AMBULATORY_CARE_PROVIDER_SITE_OTHER): Payer: BC Managed Care – PPO | Admitting: Family Medicine

## 2022-03-19 ENCOUNTER — Encounter: Payer: Self-pay | Admitting: Family Medicine

## 2022-03-19 VITALS — BP 100/64 | Ht 65.0 in | Wt 193.0 lb

## 2022-03-19 DIAGNOSIS — M170 Bilateral primary osteoarthritis of knee: Secondary | ICD-10-CM | POA: Diagnosis not present

## 2022-03-19 DIAGNOSIS — M1711 Unilateral primary osteoarthritis, right knee: Secondary | ICD-10-CM

## 2022-03-19 DIAGNOSIS — M1712 Unilateral primary osteoarthritis, left knee: Secondary | ICD-10-CM

## 2022-03-19 MED ORDER — METHYLPREDNISOLONE ACETATE 40 MG/ML IJ SUSP
40.0000 mg | Freq: Once | INTRAMUSCULAR | Status: AC
  Administered 2022-03-19: 40 mg via INTRA_ARTICULAR

## 2022-03-19 NOTE — Assessment & Plan Note (Signed)
Corticosteroid injection left knee today.

## 2022-03-19 NOTE — Progress Notes (Signed)
  Danielle Cohen - 46 y.o. female MRN [id number]  Date of birth: Nov 21, 1975    SUBJECTIVE:      Chief Complaint:/ HPI:  Bilateral but right greater than left knee pain.  Several years ago I gave her corticosteroid injection and it certainly helped for a while.  She would like to consider both knees getting injections today.  The pain in the right knee is so bad she can only walk a few steps without having to stop.  It is really interfering with her activities of daily living.    OBJECTIVE: BP 100/64   Ht 5\' 5"  (1.651 m)   Wt 193 lb (87.5 kg)   BMI 32.12 kg/m   Physical Exam:  Vital signs are reviewed. GENERAL: Well-developed female no acute distress KNEES: Bilaterally she has some synovial hypertrophy.  She has medial joint line tenderness, worse in the right knee.  She has full extension of both knees but has some pain with the last few degrees of extension on the right.  She has full flexion bilaterally.  Popliteal space is benign bilaterally.  Ligamentously intact to varus and valgus stress bilaterally. VASCULAR: Dorsalis pedis pulses 2+ bilaterally symmetrical IMAGING: MRI in 2016 of right knee revealed tricompartmental arthritis.  She has not had any imaging since then. PROCEDURE: INJECTION: Patient was given informed consent, signed copy in the chart. Appropriate time out was taken. Area prepped and draped in usual sterile fashion. Ethyl chloride was  used for local anesthesia. A 21 gauge 1 1/2 inch needle was used..  1 cc of methylprednisolone 40 mg/ml plus 4 cc of 1% lidocaine without epinephrine was injected into the bilateral knee using a(n) anterior medial approach.   The patient tolerated the procedure well. There were no complications. Post procedure instructions were given.    ASSESSMENT & PLAN:  See problem based charting & AVS for pt instructions. Primary osteoarthritis of right knee She had tricompartmental arthritis noted on MRI in 2016.  No imaging since then.   Discussed options.  She seems to be really affected in her activities of daily living so I suspect she will need a total knee replacement.  She says she does not want to do that anytime the next few months due to other scheduling issues so she would like a corticosteroid injection today.  She does agree however to go ahead and see an orthopedist so that she can get full evaluation and know her options.  I warned her that given her current state of knee arthritis and dysfunction, she may not get much of any relief from the corticosteroid injection in the right knee.  Primary osteoarthritis of left knee Corticosteroid injection left knee today.

## 2022-03-19 NOTE — Patient Instructions (Signed)
Today I gave you bilateral corticosteroid injections into your knees.  I know they have helped in the past.  I suspect you have arthritis in the right knee in particular is much worse than in the past.  The last imaging we had with [place] in 2016 and it showed tricompartmental arthritis.  I hope the shots help today but if they do not, I do think you need to go ahead and be evaluated by an orthopedic surgeon because you are going to probably need a knee replacement sometime in the near future.  As we discussed, I have sent in a referral.  It would be beneficial if you would go ahead and see them in the next month or so and talk about options.  As you know and as we discussed, giving the steroid today, would delay any future surgical intervention by the 6 to 12 weeks at least.  It was great to see you again!  Remember, you may have some increased pain tomorrow the day after the injection but you should not have pain more than 10% increase.  If you get any unusual symptoms such as significant pain computers knee swelling, fever, significant as in greater than 10% increase in pain etc., you will need to be seen immediately.

## 2022-03-19 NOTE — Assessment & Plan Note (Signed)
She had tricompartmental arthritis noted on MRI in 2016.  No imaging since then.  Discussed options.  She seems to be really affected in her activities of daily living so I suspect she will need a total knee replacement.  She says she does not want to do that anytime the next few months due to other scheduling issues so she would like a corticosteroid injection today.  She does agree however to go ahead and see an orthopedist so that she can get full evaluation and know her options.  I warned her that given her current state of knee arthritis and dysfunction, she may not get much of any relief from the corticosteroid injection in the right knee.

## 2022-04-07 ENCOUNTER — Ambulatory Visit: Payer: Self-pay

## 2022-04-07 ENCOUNTER — Ambulatory Visit (INDEPENDENT_AMBULATORY_CARE_PROVIDER_SITE_OTHER): Payer: BC Managed Care – PPO | Admitting: Orthopaedic Surgery

## 2022-04-07 ENCOUNTER — Ambulatory Visit: Payer: BC Managed Care – PPO | Admitting: Orthopaedic Surgery

## 2022-04-07 ENCOUNTER — Encounter: Payer: Self-pay | Admitting: Orthopaedic Surgery

## 2022-04-07 DIAGNOSIS — M1712 Unilateral primary osteoarthritis, left knee: Secondary | ICD-10-CM | POA: Diagnosis not present

## 2022-04-07 DIAGNOSIS — M25561 Pain in right knee: Secondary | ICD-10-CM | POA: Diagnosis not present

## 2022-04-07 DIAGNOSIS — M1711 Unilateral primary osteoarthritis, right knee: Secondary | ICD-10-CM | POA: Diagnosis not present

## 2022-04-07 DIAGNOSIS — M25562 Pain in left knee: Secondary | ICD-10-CM

## 2022-04-07 DIAGNOSIS — G8929 Other chronic pain: Secondary | ICD-10-CM

## 2022-04-07 MED ORDER — VITAMIN D 25 MCG (1000 UNIT) PO TABS: 1000.0000 [IU] | ORAL_TABLET | Freq: Every day | ORAL | 6 refills | Status: AC

## 2022-04-07 NOTE — Progress Notes (Signed)
The patient is someone who is referred from Dr. Dorcas Mcmurray to evaluate bilateral knee pain and the possibility of having knee replacement surgery.  She does speak broken [place] but does speak enough to understand things and she does have an interpreter with her today.  She had steroid injections in both knees about 2 weeks ago.  She said that is helped her quite a bit.  Before then she last had steroid injections in both knees about a year ago.  There are no x-rays for me to review of either knee and a MRI of the right knee in 2016 showed just mild thinning of the cartilage throughout the knee.  She is only 46 years old.  She has had pain for many years in her knees and the notes mention sending her to [place] to consider knee replacement surgery however she is not aware of that.  Examination of both knees today showed no pain and no effusion.  Both knees slightly hyperextend and have slight valgus malalignment.  Both knees feels ligamentously stable.  She is not requesting any type of intervention today nor do I think that she needs one given the fact that prior to the injection 2 weeks ago she said they lasted for a year.  She is requesting vitamin D3.  I will send this in since she has been on the 1000 g daily before.  She really needs to see her primary care physician about this for long-term.  We can certainly see her back for her knees if this becomes problematic but we will need x-rays if she comes back to be seen.  All question concerns were answered addressed.  Follow-up is as needed.

## 2022-09-30 ENCOUNTER — Encounter (HOSPITAL_COMMUNITY): Payer: Self-pay | Admitting: Emergency Medicine

## 2022-09-30 ENCOUNTER — Emergency Department (HOSPITAL_COMMUNITY)
Admission: EM | Admit: 2022-09-30 | Discharge: 2022-10-01 | Disposition: A | Discharge status: Home / Self Care | Payer: BC Managed Care – PPO | Attending: Emergency Medicine | Admitting: Emergency Medicine

## 2022-09-30 DIAGNOSIS — R1084 Generalized abdominal pain: Secondary | ICD-10-CM | POA: Insufficient documentation

## 2022-09-30 DIAGNOSIS — R109 Unspecified abdominal pain: Secondary | ICD-10-CM | POA: Diagnosis present

## 2022-09-30 DIAGNOSIS — R197 Diarrhea, unspecified: Secondary | ICD-10-CM | POA: Insufficient documentation

## 2022-09-30 LAB — COMPREHENSIVE METABOLIC PANEL
ALT: 14 U/L (ref 0–44)
AST: 22 U/L (ref 15–41)
Albumin: 3.5 g/dL (ref 3.5–5.0)
Alkaline Phosphatase: 53 U/L (ref 38–126)
Anion gap: 8 (ref 5–15)
BUN: 10 mg/dL (ref 6–20)
CO2: 23 mmol/L (ref 22–32)
Calcium: 9.3 mg/dL (ref 8.9–10.3)
Chloride: 106 mmol/L (ref 98–111)
Creatinine, Ser: 0.65 mg/dL (ref 0.44–1.00)
GFR, Estimated: >60 mL/min (ref >60)
Glucose, Bld: 93 mg/dL (ref 70–99)
Potassium: 4 mmol/L (ref 3.5–5.1)
Sodium: 137 mmol/L (ref 135–145)
Total Bilirubin: 0.6 mg/dL (ref 0.3–1.2)
Total Protein: 7.4 g/dL (ref 6.5–8.1)

## 2022-09-30 LAB — CBC
HCT: 40.9 % (ref 36.0–46.0)
Hemoglobin: 13.4 g/dL (ref 12.0–15.0)
MCH: 31 pg (ref 26.0–34.0)
MCHC: 32.8 g/dL (ref 30.0–36.0)
MCV: 94.7 fL (ref 80.0–100.0)
Platelets: 399 10*3/uL (ref 150–400)
RBC: 4.32 MIL/uL (ref 3.87–5.11)
RDW: 13 % (ref 11.5–15.5)
WBC: 4.8 10*3/uL (ref 4.0–10.5)
nRBC: 0 % (ref 0.0–0.2)

## 2022-09-30 LAB — URINALYSIS, ROUTINE W REFLEX MICROSCOPIC
Bilirubin Urine: NEGATIVE
Glucose, UA: NEGATIVE mg/dL
Ketones, ur: NEGATIVE mg/dL
Nitrite: NEGATIVE
Protein, ur: NEGATIVE mg/dL
Specific Gravity, Urine: 1.023 (ref 1.005–1.030)
pH: 5 (ref 5.0–8.0)

## 2022-09-30 LAB — LIPASE, BLOOD: Lipase: 30 U/L (ref 11–51)

## 2022-09-30 MED ORDER — ONDANSETRON HCL 4 MG/2ML IJ SOLN
4.0000 mg | Freq: Once | INTRAMUSCULAR | Status: AC
  Administered 2022-09-30: 4 mg via INTRAVENOUS
  Filled 2022-09-30: qty 2

## 2022-09-30 MED ORDER — ONDANSETRON HCL 4 MG/2ML IJ SOLN
4.0000 mg | Freq: Once | INTRAMUSCULAR | Status: AC
  Administered 2022-10-01: 4 mg via INTRAVENOUS
  Filled 2022-09-30: qty 2

## 2022-09-30 MED ORDER — FENTANYL CITRATE PF 50 MCG/ML IJ SOSY
50.0000 ug | PREFILLED_SYRINGE | Freq: Once | INTRAMUSCULAR | Status: AC
  Administered 2022-10-01: 50 ug via INTRAVENOUS
  Filled 2022-09-30: qty 1

## 2022-09-30 MED ORDER — SODIUM CHLORIDE 0.9 % IV BOLUS (SEPSIS)
1000.0000 mL | Freq: Once | INTRAVENOUS | Status: AC
  Administered 2022-10-01: 1000 mL via INTRAVENOUS

## 2022-09-30 NOTE — ED Provider Triage Note (Signed)
Emergency Medicine Provider Triage Evaluation Note  Danielle Cohen , a 46 y.o. female  was evaluated in triage.  Pt complains of Abd pain began yesterday and has history of cholecystectomy and appendectomy years ago.  Seems that her pain became worse today.  She has had watery diarrhea nausea and vomiting.  No chest pain difficulty breathing.  No fevers at home.  No urinary frequency urgency dysuria or hematuria.  Review of Systems  Positive: Abdominal pain Negative: Fever  Physical Exam  BP 116/81 (BP Location: Right Arm)   Pulse 88   Temp 98 F (36.7 C) (Oral)   Resp 16   Ht 5\' 5"  (1.651 m)   Wt 87 kg   SpO2 100%   BMI 31.92 kg/m  Gen:   Awake, no distress   Resp:  Normal effort  MSK:   Moves extremities without difficulty  Other:  Abd diffusely TTP. No guarding. Moaning in discomfort.   Medical Decision Making  Medically screening exam initiated at 6:32 PM.  Appropriate orders placed.  Danielle Cohen was informed that the remainder of the evaluation will be completed by another provider, this initial triage assessment does not replace that evaluation, and the importance of remaining in the ED until their evaluation is complete.  [address], [place]   [address] [place], [place] 09/30/22 [phone]

## 2022-09-30 NOTE — ED Triage Notes (Signed)
Pt endorses generalized abd pain, n/v since last night that worsened today. Also back pain.

## 2022-09-30 NOTE — ED Notes (Signed)
RN attempted to collect urine patient informed RN she gave sample earlier. RN will try to recollect later.

## 2022-10-01 ENCOUNTER — Telehealth: Payer: Self-pay | Admitting: *Deleted

## 2022-10-01 ENCOUNTER — Emergency Department (HOSPITAL_COMMUNITY): Payer: BC Managed Care – PPO

## 2022-10-01 LAB — HCG, QUANTITATIVE, PREGNANCY: hCG, Beta Chain, Quant, S: 2 m[IU]/mL (ref <5)

## 2022-10-01 MED ORDER — IOHEXOL 350 MG/ML SOLN
75.0000 mL | Freq: Once | INTRAVENOUS | Status: AC | PRN
  Administered 2022-10-01: 75 mL via INTRAVENOUS

## 2022-10-01 NOTE — ED Provider Notes (Signed)
[facility] EMERGENCY DEPARTMENT Provider Note   CSN: [id number] Arrival date & time: 09/30/22  1713     History  Chief Complaint  Patient presents with   Abdominal Pain    Danielle Cohen is a 46 y.o. female.  The history is provided by the patient and a relative. A language interpreter was used (Arabic [phone]).  Abdominal Pain Associated symptoms: diarrhea   Associated symptoms: no fever    Patient speaks Arabic and [place].  Arabic interpreter was at bedside.  Family was also at bedside to assist with history at patient request  Patient reports onset of abdominal pain over the past day.  She has  also had multiple episodes of nonbloody diarrhea.  She reports nausea.  No vomiting.  She reports previous history of appendectomy and hernia repair No fevers are reported No recent travel.  No sick contacts    Home Medications Prior to Admission medications   Medication Sig Start Date End Date Taking? Authorizing Provider  cholecalciferol (VITAMIN D3) 25 MCG (1000 UNIT) tablet Take 1 tablet (1,000 Units total) by mouth daily. 04/07/22   Kathryne Hitch, MD  oxyCODONE (OXY IR/ROXICODONE) 5 MG immediate release tablet Take 1 tablet (5 mg total) by mouth every 6 (six) hours as needed for moderate pain or severe pain. Patient not taking: Reported on 03/06/2021 02/27/20   Abigail Miyamoto, MD      Allergies    Patient has no known allergies.    Review of Systems   Review of Systems  Constitutional:  Negative for fever.  Gastrointestinal:  Positive for abdominal pain and diarrhea. Negative for blood in stool.    Physical Exam Updated Vital Signs BP 103/81 (BP Location: Left Arm)   Pulse 87   Temp 98.6 F (37 C) (Oral)   Resp 20   Ht 1.651 m (5\' 5" )   Wt 87 kg   SpO2 100%   BMI 31.92 kg/m  Physical Exam CONSTITUTIONAL: Well developed/well nourished HEAD: Normocephalic/atraumatic EYES: EOMI/PERRL, no icterus ENMT: Mucous membranes moist NECK:  supple no meningeal signs CV: S1/S2 noted, no murmurs/rubs/gallops noted LUNGS: Lungs are clear to auscultation bilaterally, no apparent distress ABDOMEN: soft, diffuse moderate tenderness, no rebound or guarding, bowel sounds noted throughout abdomen NEURO: Pt is awake/alert/appropriate, moves all extremitiesx4.  No facial droop.   EXTREMITIES:full ROM PSYCH: no abnormalities of mood noted, alert and oriented to situation  ED Results / Procedures / Treatments   Labs (all labs ordered are listed, but only abnormal results are displayed) Labs Reviewed  URINALYSIS, ROUTINE W REFLEX MICROSCOPIC - Abnormal; Notable for the following components:      Result Value   APPearance HAZY (*)    Hgb urine dipstick MODERATE (*)    Leukocytes,Ua SMALL (*)    Bacteria, UA RARE (*)    All other components within normal limits  LIPASE, BLOOD  COMPREHENSIVE METABOLIC PANEL  CBC  HCG, QUANTITATIVE, PREGNANCY    EKG None  Radiology CT ABDOMEN PELVIS W CONTRAST  Result Date: 10/01/2022 CLINICAL DATA:  Abdominal pain with vomiting and diarrhea. EXAM: CT ABDOMEN AND PELVIS WITH CONTRAST TECHNIQUE: Multidetector CT imaging of the abdomen and pelvis was performed using the standard protocol following bolus administration of intravenous contrast. RADIATION DOSE REDUCTION: This exam was performed according to the departmental dose-optimization program which includes automated exposure control, adjustment of the mA and/or kV according to patient size and/or use of iterative reconstruction technique. CONTRAST:  37mL OMNIPAQUE IOHEXOL 350 MG/ML SOLN  COMPARISON:  Mar 12, 2008 FINDINGS: Lower chest: No acute abnormality. Hepatobiliary: No focal liver abnormality is seen. Status post cholecystectomy. No biliary dilatation. Pancreas: Unremarkable. No pancreatic ductal dilatation or surrounding inflammatory changes. Spleen: Normal in size without focal abnormality. Adrenals/Urinary Tract: Adrenal glands are unremarkable.  Kidneys are normal, without renal calculi, focal lesion, or hydronephrosis. Bladder is unremarkable. Stomach/Bowel: Stomach is within normal limits. The appendix is surgically absent. No evidence of bowel wall thickening, distention, or inflammatory changes. Vascular/Lymphatic: No significant vascular findings are present. No enlarged abdominal or pelvic lymph nodes. Reproductive: Uterus and bilateral adnexa are unremarkable. Other: A very small fat containing umbilical hernia is noted. No abdominopelvic ascites. Musculoskeletal: No acute or significant osseous findings. IMPRESSION: 1. No acute findings in the abdomen or pelvis. 2. Evidence of prior cholecystectomy and appendectomy. Electronically Signed   By: Aram Candela M.D.   On: 10/01/2022 01:25    Procedures Procedures    Medications Ordered in ED Medications  ondansetron (ZOFRAN) injection 4 mg (4 mg Intravenous Given 09/30/22 1840)  fentaNYL (SUBLIMAZE) injection 50 mcg (50 mcg Intravenous Given 10/01/22 0003)  ondansetron (ZOFRAN) injection 4 mg (4 mg Intravenous Given 10/01/22 0003)  sodium chloride 0.9 % bolus 1,000 mL (0 mLs Intravenous Stopped 10/01/22 0033)  iohexol (OMNIPAQUE) 350 MG/ML injection 75 mL (75 mLs Intravenous Contrast Given 10/01/22 0119)    ED Course/ Medical Decision Making/ A&P Clinical Course as of 10/01/22 0226  Fri Oct 01, 2022  0226 CT scan negative for acute findings.  Labs overall reassuring.  Patient feels improved.  She will be discharged home. [DW]    Clinical Course User Index [DW] Zadie Rhine, MD                           Medical Decision Making Amount and/or Complexity of Data Reviewed Labs: ordered.  Risk Prescription drug management.   This patient presents to the ED for concern of abdominal pain, this involves an extensive number of treatment options, and is a complaint that carries with it a high risk of complications and morbidity.  The differential diagnosis includes but is not  limited to cholecystitis, cholelithiasis, pancreatitis, gastritis, peptic ulcer disease, appendicitis, bowel obstruction, bowel perforation, diverticulitis, AAA, ischemic bowel    Comorbidities that complicate the patient evaluation: Patient's presentation is complicated by their history of multiple surgeries  Social Determinants of Health: Patient's  English is a second language   increases the complexity of managing their presentation  Additional history obtained: Additional history obtained from family Records reviewed previous admission documents  Lab Tests: I Ordered, and personally interpreted labs.  The pertinent results include: Labs overall unremarkable  Imaging Studies ordered: I ordered imaging studies including CT scan abdomen pelvis   I independently visualized and interpreted imaging which showed no acute findings I agree with the radiologist interpretation   Medicines ordered and prescription drug management: I ordered medication including fentanyl for pain Reevaluation of the patient after these medicines showed that the patient    improved   After the interventions noted above, I reevaluated the patient and found that they have :improved  Complexity of problems addressed: Patient's presentation is most consistent with  acute presentation with potential threat to life or bodily function  Disposition: After consideration of the diagnostic results and the patient's response to treatment,  I feel that the patent would benefit from discharge   .  Final Clinical Impression(s) / ED Diagnoses Final diagnoses:  Generalized abdominal pain  Diarrhea of presumed infectious origin    Rx / DC Orders ED Discharge Orders     None         Zadie Rhine, MD 10/01/22 [phone]

## 2022-10-01 NOTE — Telephone Encounter (Signed)
Transition Care Management Unsuccessful Follow-up Telephone Call  Date of discharge and from where:  10/01/2022 [facility] ED  Attempts:  1st Attempt  Reason for unsuccessful TCM follow-up call:  Unable to leave message, VM not set up

## 2022-10-04 NOTE — Telephone Encounter (Signed)
Transition Care Management Unsuccessful Follow-up Telephone Call  Date of discharge and from where:  [facility] ED 10/01/2022  Attempts:  2nd Attempt  Reason for unsuccessful TCM follow-up call:  Left voice message

## 2022-10-06 ENCOUNTER — Telehealth (HOSPITAL_COMMUNITY): Payer: Self-pay

## 2023-06-14 ENCOUNTER — Ambulatory Visit (INDEPENDENT_AMBULATORY_CARE_PROVIDER_SITE_OTHER): Payer: BC Managed Care – PPO | Admitting: Physician Assistant

## 2023-06-14 ENCOUNTER — Other Ambulatory Visit (INDEPENDENT_AMBULATORY_CARE_PROVIDER_SITE_OTHER): Payer: BC Managed Care – PPO

## 2023-06-14 ENCOUNTER — Encounter: Payer: Self-pay | Admitting: Physician Assistant

## 2023-06-14 DIAGNOSIS — M5441 Lumbago with sciatica, right side: Secondary | ICD-10-CM

## 2023-06-14 DIAGNOSIS — M5442 Lumbago with sciatica, left side: Secondary | ICD-10-CM

## 2023-06-14 DIAGNOSIS — M1712 Unilateral primary osteoarthritis, left knee: Secondary | ICD-10-CM

## 2023-06-14 DIAGNOSIS — M170 Bilateral primary osteoarthritis of knee: Secondary | ICD-10-CM

## 2023-06-14 DIAGNOSIS — M1711 Unilateral primary osteoarthritis, right knee: Secondary | ICD-10-CM | POA: Diagnosis not present

## 2023-06-14 MED ORDER — METHYLPREDNISOLONE ACETATE 40 MG/ML IJ SUSP
40.0000 mg | INTRAMUSCULAR | Status: AC | PRN
  Administered 2023-06-14: 40 mg via INTRA_ARTICULAR

## 2023-06-14 MED ORDER — LIDOCAINE HCL 1 % IJ SOLN
5.0000 mL | INTRAMUSCULAR | Status: AC | PRN
  Administered 2023-06-14: 5 mL

## 2023-06-14 NOTE — Progress Notes (Signed)
Office Visit Note   Patient: Danielle Cohen           Date of Birth: [birth year]           MRN: [id number] Visit Date: 06/14/2023              Requested by: Georgina Quint, MD [address] [place],  [place] [address] PCP: Georgina Quint, MD   Assessment & Plan: Visit Diagnoses:  1. Primary osteoarthritis of left knee   2. Primary osteoarthritis of right knee   3. Acute low back pain with bilateral sciatica, unspecified back pain laterality     Plan: Given patient is complaining of radiculopathy down both legs recommend therapy for lumbar spine is to work on range of motion core strengthening, include modalities and home exercise program.  Regards to bilateral knees recommend therapy for quad strengthening both knees.  She also asked about cortisone injections which she has got good relief and therefore we provided her cortisone injections both knees today which she tolerated well.  Follow-up with [place] in 5 to 6 weeks see how she is doing overall.  Questions encouraged and answered today using   Follow-Up Instructions: Return in about 5 weeks (around 07/19/2023).   Orders:  Orders Placed This Encounter  Procedures   Large Joint Inj   XR Lumbar Spine 2-3 Views   XR Knee 1-2 Views Left   XR Knee 1-2 Views Right   No orders of the defined types were placed in this encounter.     Procedures: Large Joint Inj: bilateral knee on 06/14/2023 5:30 PM Indications: pain Details: 22 G 1.5 in needle, anterolateral approach  Arthrogram: No  Medications (Right): 5 mL lidocaine 1 %; 40 mg methylPREDNISolone acetate 40 MG/ML Medications (Left): 5 mL lidocaine 1 %; 40 mg methylPREDNISolone acetate 40 MG/ML Outcome: tolerated well, no immediate complications Procedure, treatment alternatives, risks and benefits explained, specific risks discussed. Consent was given by the patient. Immediately prior to procedure a time out was called to verify the correct patient, procedure,  equipment, support staff and site/side marked as required. Patient was prepped and draped in the usual sterile fashion.       Clinical Data: No additional findings.   Subjective: Chief Complaint  Patient presents with   Right Knee - Pain   Left Knee - Pain    HPI Patient is a 47 year old Arabic speaking female who speaks some English.  However an interpreter is present today.  She comes in today with bilateral knee pain.  She was last seen by Dr. Magnus Ivan in June 2023.  This was for bilateral knee pain.  At that point time she had received injections 2 weeks prior to seeing Dr. Raye Sorrow and reports she got good relief until recently.  She said no fall or injury to either knee.  She states it hurts for her to walk to kneel or stand for long time due to knee pain.  She notes some swelling both knees.  She also notes pain going downstairs.  She has tried Tylenol and ibuprofen.  She also notes that she is having pain that radiates down both legs and describes it as numbness tingling down into her feet and toes at times.  This is with prolonged standing.  Denies any back pain.  No waking pain.  Pain is worse with prolonged standing.  Review of Systems Negative for fevers or chills.  She is nondiabetic.  Objective: Vital Signs: There were no vitals taken  for this visit.  Physical Exam Constitutional:      Appearance: She is not ill-appearing or diaphoretic.  Neurological:     Mental Status: She is alert and oriented to person, place, and time.  Psychiatric:        Behavior: Behavior normal.     Ortho Exam Bilateral knees no abnormal warmth erythema or effusion.  Bilateral knees hyperextend.  No instability valgus varus stressing of either knee.  Valgus malalignment bilaterally. Lower extremities 5 out of 5 strength throughout the lower extremities against resistance negative straight leg raise.  Dorsal pedal pulses 2+ bilaterally.  Good range of motion bilateral hips.  Sensation grossly  intact bilateral feet to light touch throughout.  Specialty Comments:  No specialty comments available.  Imaging: XR Knee 1-2 Views Right  Result Date: 06/14/2023 Right knee 2 views: Knee is well located.  Mild to moderate patellofemoral arthritic changes.  Mild medial compartmental narrowing.  Lateral compartment overall well-preserved.  No other bony abnormalities or acute findings.  XR Knee 1-2 Views Left  Result Date: 06/14/2023 Left knee 2 views: No acute fracture.  Knee is well located.  Mild to moderate patellofemoral arthritic changes.  Medial lateral compartments well-preserved.  No bony abnormalities otherwise.  XR Lumbar Spine 2-3 Views  Result Date: 06/14/2023 Lumbar spine 2 views: No acute fractures.  Disc base overall well-maintained.  No spondylolisthesis.  Normal lordotic curvature.    PMFS History: Patient Active Problem List   Diagnosis Date Noted   Primary osteoarthritis of left knee 03/19/2022   Primary osteoarthritis of right knee 03/19/2022   Incisional hernia 02/26/2020   Thyroid disease    Past Medical History:  Diagnosis Date   Chest pain 03/11/2015   Right sided chest pain. Evaluated in ED. No evidence for being cardiac. No PE>    Cholecystitis 04/2012   Gallstones    Multiparous 06/16/2011   Right knee pain 03/11/2015   Tear, knee, lateral meniscus 05/20/2015   Thyroid disease    told had a thyroid cyst 11/11- needs recheck in 6 months, not done    Family History  Problem Relation Age of Onset   Anesthesia problems Neg Hx     Past Surgical History:  Procedure Laterality Date   APPENDECTOMY     in [place]   CHOLECYSTECTOMY  04/06/2012   Procedure: LAPAROSCOPIC CHOLECYSTECTOMY WITH INTRAOPERATIVE CHOLANGIOGRAM;  Surgeon: Atilano Ina, MD,FACS;  Location: [facility] OR;  Service: General;  Laterality: N/A;   INCISIONAL HERNIA REPAIR  02/26/2020   INCISIONAL HERNIA REPAIR N/A 02/26/2020   Procedure: HERNIA REPAIR INCISIONAL WITH MESH;  Surgeon: Abigail Miyamoto, MD;  Location: [facility] OR;  Service: General;  Laterality: N/A;   vaginal births     6 births- 1 set of twins 7yrs- 6months   Social History   Occupational History   Not on file  Tobacco Use   Smoking status: Never   Smokeless tobacco: Never  Vaping Use   Vaping status: Never Used  Substance and Sexual Activity   Alcohol use: No   Drug use: No   Sexual activity: Yes

## 2023-06-16 ENCOUNTER — Other Ambulatory Visit: Payer: Self-pay

## 2023-06-16 DIAGNOSIS — M545 Low back pain, unspecified: Secondary | ICD-10-CM

## 2023-06-27 ENCOUNTER — Ambulatory Visit (INDEPENDENT_AMBULATORY_CARE_PROVIDER_SITE_OTHER): Payer: BC Managed Care – PPO | Admitting: Physical Therapy

## 2023-06-27 ENCOUNTER — Encounter: Payer: Self-pay | Admitting: Physical Therapy

## 2023-06-27 ENCOUNTER — Other Ambulatory Visit: Payer: Self-pay

## 2023-06-27 ENCOUNTER — Ambulatory Visit: Payer: BC Managed Care – PPO | Admitting: Physical Therapy

## 2023-06-27 DIAGNOSIS — R262 Difficulty in walking, not elsewhere classified: Secondary | ICD-10-CM | POA: Diagnosis not present

## 2023-06-27 DIAGNOSIS — M25562 Pain in left knee: Secondary | ICD-10-CM | POA: Diagnosis not present

## 2023-06-27 DIAGNOSIS — M5459 Other low back pain: Secondary | ICD-10-CM

## 2023-06-27 DIAGNOSIS — G8929 Other chronic pain: Secondary | ICD-10-CM

## 2023-06-27 DIAGNOSIS — M25561 Pain in right knee: Secondary | ICD-10-CM | POA: Diagnosis not present

## 2023-06-27 NOTE — Therapy (Addendum)
OUTPATIENT PHYSICAL THERAPY THORACOLUMBAR EVALUATION / DISCHARGE   Patient Name: Danielle Cohen MRN: [id number] DOB:[birth year], 47 y.o., female Today's Date: 06/27/2023  END OF SESSION:  PT End of Session - 06/27/23 1445     Visit Number 1    Number of Visits 16    Date for PT Re-Evaluation 08/26/23    PT Start Time 1436    PT Stop Time 1515    PT Time Calculation (min) 39 min    Activity Tolerance Patient tolerated treatment well    Behavior During Therapy [facility] for tasks assessed/performed             Past Medical History:  Diagnosis Date   Chest pain 03/11/2015   Right sided chest pain. Evaluated in ED. No evidence for being cardiac. No PE>    Cholecystitis 04/2012   Gallstones    Multiparous 06/16/2011   Right knee pain 03/11/2015   Tear, knee, lateral meniscus 05/20/2015   Thyroid disease    told had a thyroid cyst 11/11- needs recheck in 6 months, not done   Past Surgical History:  Procedure Laterality Date   APPENDECTOMY     in [place]   CHOLECYSTECTOMY  04/06/2012   Procedure: LAPAROSCOPIC CHOLECYSTECTOMY WITH INTRAOPERATIVE CHOLANGIOGRAM;  Surgeon: Atilano Ina, MD,FACS;  Location: [facility] OR;  Service: General;  Laterality: N/A;   INCISIONAL HERNIA REPAIR  02/26/2020   INCISIONAL HERNIA REPAIR N/A 02/26/2020   Procedure: HERNIA REPAIR INCISIONAL WITH MESH;  Surgeon: Abigail Miyamoto, MD;  Location: [facility] OR;  Service: General;  Laterality: N/A;   vaginal births     6 births- 1 set of twins 57yrs- 6months   Patient Active Problem List   Diagnosis Date Noted   Primary osteoarthritis of left knee 03/19/2022   Primary osteoarthritis of right knee 03/19/2022   Incisional hernia 02/26/2020   Thyroid disease     PCP: Georgina Quint, MD   REFERRING PROVIDER: Kirtland Bouchard, PA-C   REFERRING DIAG:  Diagnosis  M54.50 (ICD-10-CM) - Low back pain, unspecified back pain laterality, unspecified chronicity, unspecified whether sciatica present    Rationale for  Evaluation and Treatment: Rehabilitation  THERAPY DIAG:  Chronic pain of right knee - Plan: PT plan of care cert/re-cert  Chronic pain of left knee - Plan: PT plan of care cert/re-cert  Other low back pain - Plan: PT plan of care cert/re-cert  Difficulty in walking, not elsewhere classified - Plan: PT plan of care cert/re-cert  ONSET DATE: ongoing for about 3 years  SUBJECTIVE:  SUBJECTIVE STATEMENT: Pt arriving today reporting pain in bil knee pain when she walks, stands and bends. Pt reporting pain is worse on the right. Pt reporting pain reaches 10/10 when trying to climb stairs.   PERTINENT HISTORY:  Chronic Rt knee pain, chronic left knee pain, lateral meniscus tear 2016, thyroid disease, hernia repair  PAIN:  NPRS scale: 7-8/10 at worse Pain location: bil knees, Rt knee worse than left Pain description: achy Aggravating factors: walking, standing, bending Relieving factors: resting, sitting  PRECAUTIONS: None  WEIGHT BEARING RESTRICTIONS: No  FALLS:  Has patient fallen in last 6 months? No  LIVING ENVIRONMENT: Lives with: lives with their family and lives with their spouse Lives in: House/apartment Stairs: No Has following equipment at home: None  OCCUPATION: pt works at [employer] and [employer] on her feet all day  PLOF: Independent  PATIENT GOALS: To feel better  Next MD Visit:    OBJECTIVE:   DIAGNOSTIC FINDINGS:  Left knee 2 views: No acute fracture.  Knee is well located.  Mild to  moderate patellofemoral arthritic changes.  Medial lateral compartments  well-preserved.  No bony abnormalities otherwise.   Lumbar spine 2 views: No acute fractures.  Disc base overall well-maintained.  No spondylolisthesis.  Normal lordotic curvature.     PATIENT SURVEYS:  FOTO eval:    71%   SCREENING FOR RED FLAGS: Bowel or bladder incontinence: No Cauda equina syndrome: No  COGNITION: Overall cognitive status: WFL normal      SENSATION: WFL   POSTURE:  rounded shoulders and forward head, increased lumbar curvature  PALPATION: TTP: along bil knees at joint line both medial and lateral   LUMBAR ROM:   AROM 06/27/23  Flexion 78  Extension 18  Right lateral flexion 35  Left lateral flexion 30  Right rotation WFL  Left rotation WFL   (Blank rows = not tested)  LOWER EXTREMITY ROM:       Right eval Left eval  Hip flexion 95 95  Hip extension    Hip abduction    Hip adduction    Hip internal rotation    Hip external rotation    Knee flexion    Knee extension    Ankle dorsiflexion    Ankle plantarflexion    Ankle inversion    Ankle eversion     (Blank rows = not tested)  LOWER EXTREMITY MMT:    MMT Right eval Left eval  Hip flexion 4- 4-  Hip extension    Hip abduction 4+ 4+  Hip adduction    Hip internal rotation    Hip external rotation    Knee flexion 5 4+  Knee extension 4 4  Ankle dorsiflexion    Ankle plantarflexion    Ankle inversion    Ankle eversion     (Blank rows = not tested)  LUMBAR SPECIAL TESTS:  Slump test: Negative bilaterally  FUNCTIONAL TESTS:  5 times sit to stand: 22 seconds c UE support  GAIT: Distance walked: clinic distance level surface Assistive device utilized: None Level of assistance: Complete Independence Comments: increase lumbar curvature with anterior pelvic tilt  TODAY'S TREATMENT:                                                                                                         DATE: 06/27/23  Therex:    HEP instruction/performance c cues for techniques, handout provided.  Trial  set performed of each for comprehension and symptom assessment.  See below for exercise list  PATIENT EDUCATION:  Education details: HEP, POC Person educated: Patient Education method: Explanation, Demonstration, Verbal cues, and Handouts Education comprehension: verbalized understanding, returned demonstration, and verbal cues required  HOME EXERCISE PROGRAM: Access Code: [email or web address] URL: [email or web address] Date: 06/27/2023 Prepared by: Narda Amber  Exercises - Supine Lower Trunk Rotation  - 2 x daily - 7 x weekly - 3 reps - 30 seconds hold - Supine Bridge  - 2 x daily - 7 x weekly - 2 sets - 10 reps - 5 seconds hold - Supine Active Straight Leg Raise  - 2 x daily - 7 x weekly - 2 sets - 10 reps  ASSESSMENT:  CLINICAL IMPRESSION: Patient is a 47 y.o. who comes to clinic with complaints of bilateral knee pain with her Rt worse than her left and low back pain. Pt presents with mobility, strength and movement coordination deficits that impair their ability to perform usual daily and recreational functional activities without increase difficulty/symptoms at this time.  Patient to benefit from skilled PT services to address impairments and limitations to improve to previous level of function without restriction secondary to condition.   OBJECTIVE IMPAIRMENTS: decreased balance, decreased mobility, difficulty walking, decreased ROM, decreased strength, obesity, and pain.   ACTIVITY LIMITATIONS: lifting, bending, sitting, standing, squatting, stairs, and transfers  PARTICIPATION LIMITATIONS: cleaning, laundry, shopping, community activity, and occupation  PERSONAL FACTORS: 3+ comorbidities: see pertinent history above  are also affecting patient's functional outcome.   REHAB POTENTIAL: Good  CLINICAL DECISION MAKING: Stable/uncomplicated  EVALUATION COMPLEXITY: Low   GOALS: Goals reviewed with patient? Yes  SHORT TERM GOALS: (target date for Short term goals are  3 weeks 07/22/23)  1. Patient will demonstrate independent use of home exercise program to maintain progress from in clinic treatments.  Goal status: New  LONG TERM GOALS: (target dates for all long term goals are  weeks  08/26/23 )   1. Patient will demonstrate/report pain at worst less than or equal to 2/10 to facilitate minimal limitation in daily activity secondary to pain symptoms.  Goal status: New   2. Patient will demonstrate independent use of home exercise program to facilitate ability to maintain/progress functional gains from skilled physical therapy services.  Goal status: New   3. Patient will demonstrate FOTO outcome > or = 73 % to indicate reduced disability due to condition.  Goal status: New   4. Pt will be able to navigate up and down 5 steps with single hand rail with reciprocal gait pattern.   Goal status: New   5.  Pt will be able to improve her 5 time sit to stand to </= 12 seconds with no UE support.   Goal status:  New   6.  Pt will be able to lift 10# from floor to counter height with pain in her low back to </=2/10.  Goal status: New   PLAN:  PT FREQUENCY: 1-2x/week  PT DURATION: 8 weeks (08/26/23)  PLANNED INTERVENTIONS: Therapeutic exercises, Therapeutic activity, Neuro Muscular re-education, Balance training, Gait training, Patient/Family education, Joint mobilization, Stair training, DME instructions, Dry Needling, Electrical stimulation, Cryotherapy, vasopneumatic device, Moist heat, Taping, Traction Ultrasound, Ionotophoresis 4mg /ml Dexamethasone, and aquatic therapy, Manual therapy.  All included unless contraindicated  PLAN FOR NEXT SESSION: Review HEP knowledge/results, core strengthening, quad strengthening     Sharmon Leyden, PT, MPT 06/27/2023, 4:26 PM   PHYSICAL THERAPY DISCHARGE SUMMARY  Visits from Start of Care: 1  Current functional level related to goals / functional outcomes: See note   Remaining deficits: See  note   Education / Equipment: HEP  Patient goals were not met. Patient is being discharged due to not returning since the last visit.  Chyrel Masson, PT, DPT, OCS, ATC 07/29/23  11:00 AM

## 2023-07-06 ENCOUNTER — Encounter: Payer: PRIVATE HEALTH INSURANCE | Admitting: Physical Therapy

## 2023-07-11 ENCOUNTER — Encounter: Payer: PRIVATE HEALTH INSURANCE | Admitting: Physical Therapy

## 2023-07-13 ENCOUNTER — Encounter: Payer: PRIVATE HEALTH INSURANCE | Admitting: Physical Therapy

## 2023-07-18 ENCOUNTER — Encounter: Payer: PRIVATE HEALTH INSURANCE | Admitting: Physical Therapy

## 2023-07-20 ENCOUNTER — Encounter: Payer: PRIVATE HEALTH INSURANCE | Admitting: Physical Therapy

## 2023-07-25 ENCOUNTER — Encounter: Payer: PRIVATE HEALTH INSURANCE | Admitting: Physical Therapy

## 2023-07-27 ENCOUNTER — Encounter: Payer: PRIVATE HEALTH INSURANCE | Admitting: Physical Therapy
